# Patient Record
Sex: Female | Born: 1965 | Race: Black or African American | Hispanic: No | Marital: Married
Health system: Midwestern US, Community
[De-identification: ages and names within clinical notes are randomized; demographics above are authoritative.]

## PROBLEM LIST (undated history)

## (undated) DIAGNOSIS — N83209 Unspecified ovarian cyst, unspecified side: Secondary | ICD-10-CM

## (undated) DIAGNOSIS — F419 Anxiety disorder, unspecified: Secondary | ICD-10-CM

## (undated) DIAGNOSIS — F329 Major depressive disorder, single episode, unspecified: Secondary | ICD-10-CM

## (undated) DIAGNOSIS — M797 Fibromyalgia: Secondary | ICD-10-CM

## (undated) DIAGNOSIS — F32A Depression, unspecified: Secondary | ICD-10-CM

## (undated) DIAGNOSIS — F410 Panic disorder [episodic paroxysmal anxiety] without agoraphobia: Secondary | ICD-10-CM

## (undated) DIAGNOSIS — N83201 Unspecified ovarian cyst, right side: Secondary | ICD-10-CM

## (undated) DIAGNOSIS — N6002 Solitary cyst of left breast: Secondary | ICD-10-CM

## (undated) DIAGNOSIS — N83202 Unspecified ovarian cyst, left side: Secondary | ICD-10-CM

## (undated) HISTORY — PX: ABDOMINAL HYSTERECTOMY: SHX81

## (undated) HISTORY — PX: KNEE SURGERY: SHX244

---

## 2011-08-23 ENCOUNTER — Emergency Department (HOSPITAL_COMMUNITY)
Admission: EM | Admit: 2011-08-23 | Discharge: 2011-08-24 | Disposition: A | Discharge status: Home / Self Care | Payer: Medicare Other | Attending: Emergency Medicine | Admitting: Emergency Medicine

## 2011-08-23 ENCOUNTER — Encounter (HOSPITAL_COMMUNITY): Payer: Self-pay | Admitting: Emergency Medicine

## 2011-08-23 ENCOUNTER — Emergency Department (HOSPITAL_COMMUNITY): Payer: Medicare Other

## 2011-08-23 DIAGNOSIS — Z79899 Other long term (current) drug therapy: Secondary | ICD-10-CM | POA: Insufficient documentation

## 2011-08-23 DIAGNOSIS — F341 Dysthymic disorder: Secondary | ICD-10-CM | POA: Insufficient documentation

## 2011-08-23 DIAGNOSIS — R0602 Shortness of breath: Secondary | ICD-10-CM | POA: Insufficient documentation

## 2011-08-23 DIAGNOSIS — J45909 Unspecified asthma, uncomplicated: Secondary | ICD-10-CM | POA: Insufficient documentation

## 2011-08-23 DIAGNOSIS — R079 Chest pain, unspecified: Secondary | ICD-10-CM | POA: Insufficient documentation

## 2011-08-23 DIAGNOSIS — R1032 Left lower quadrant pain: Secondary | ICD-10-CM | POA: Insufficient documentation

## 2011-08-23 DIAGNOSIS — R112 Nausea with vomiting, unspecified: Secondary | ICD-10-CM | POA: Insufficient documentation

## 2011-08-23 HISTORY — DX: Depression, unspecified: F32.A

## 2011-08-23 HISTORY — DX: Anxiety disorder, unspecified: F41.9

## 2011-08-23 HISTORY — DX: Unspecified ovarian cyst, unspecified side: N83.209

## 2011-08-23 HISTORY — DX: Panic disorder (episodic paroxysmal anxiety): F41.0

## 2011-08-23 HISTORY — DX: Major depressive disorder, single episode, unspecified: F32.9

## 2011-08-23 LAB — URINALYSIS, ROUTINE W REFLEX MICROSCOPIC
Bilirubin Urine: NEGATIVE
Hgb urine dipstick: NEGATIVE
Specific Gravity, Urine: 1.013 (ref 1.005–1.030)
pH: 7.5 (ref 5.0–8.0)

## 2011-08-23 LAB — DIFFERENTIAL
Basophils Relative: 0 % (ref 0–1)
Eosinophils Relative: 1 % (ref 0–5)
Monocytes Absolute: 0.5 10*3/uL (ref 0.1–1.0)
Monocytes Relative: 9 % (ref 3–12)
Neutro Abs: 3.5 10*3/uL (ref 1.7–7.7)

## 2011-08-23 LAB — BASIC METABOLIC PANEL
BUN: 12 mg/dL (ref 6–23)
CO2: 23 mEq/L (ref 19–32)
Chloride: 103 mEq/L (ref 96–112)
Creatinine, Ser: 0.68 mg/dL (ref 0.50–1.10)
GFR calc Af Amer: >90 mL/min (ref >90)

## 2011-08-23 LAB — CBC
HCT: 39.6 % (ref 36.0–46.0)
Hemoglobin: 13.2 g/dL (ref 12.0–15.0)
MCHC: 33.3 g/dL (ref 30.0–36.0)
MCV: 89.4 fL (ref 78.0–100.0)

## 2011-08-23 MED ORDER — MORPHINE SULFATE 4 MG/ML IJ SOLN
4.0000 mg | Freq: Once | INTRAMUSCULAR | Status: AC
  Administered 2011-08-23: 4 mg via INTRAVENOUS
  Filled 2011-08-23: qty 1

## 2011-08-23 MED ORDER — ONDANSETRON HCL 4 MG/2ML IJ SOLN
4.0000 mg | Freq: Once | INTRAMUSCULAR | Status: AC
  Administered 2011-08-23: 4 mg via INTRAVENOUS
  Filled 2011-08-23: qty 2

## 2011-08-23 MED ORDER — ASPIRIN 81 MG PO CHEW
324.0000 mg | CHEWABLE_TABLET | Freq: Once | ORAL | Status: AC
Start: 2011-08-23 — End: 2011-08-23
  Administered 2011-08-23: 324 mg via ORAL
  Filled 2011-08-23: qty 4

## 2011-08-23 NOTE — ED Notes (Signed)
States she has an ovarian cyst that is going and affecting her ability to walk x about 3 days.  Also has been having on/off chest pains.  New to town and has no PMD here.

## 2011-08-23 NOTE — ED Notes (Signed)
Pt presenting to ed with c/o chest pain and lower abdominal pain. Pt states onset of chest pain 3 days ago. pt states but symptoms have been intermittent for total 3 weeks. Pt states positive nausea and vomiting with shortness of breath

## 2011-08-23 NOTE — ED Notes (Signed)
Patient transported to Ultrasound with chaperone. 

## 2011-08-23 NOTE — ED Provider Notes (Signed)
History     CSN: [id number]  Arrival date & time 08/23/11  1627   First MD Initiated Contact with Patient 08/23/11 2023      Chief Complaint  Patient presents with  . Ovarian Cyst    LT side  . Chest Pain     HPI  History provided by the patient. Patient is a 46 year old female past history of left ovarian cyst, asthma, anxiety and depression who presents with complaints of chest pain and pressure as well as left lower abdominal pains. Patient reports chest pain began 3 days ago. Pain has some waxing and waning but is constant. Pain seems to be worse with increased stress. Pain is not aggravated with exertion, eating or movements. Pain does not radiate. Patient also complains of left lower quadrant pains she states are similar to left ovarian cyst. Sure ports history of large left ovarian cyst 6-7 cm. Patient had been on medications in the past no GYN and states since that reduced in size during last checkup in June. Pain is now returned and feels similar to previous cyst pains. Patient does have history of hysterectomy. She denies any dysuria, hematuria, urinary frequency, vaginal bleeding or vaginal discharge. Symptoms are described as severe. She denies any aggravating or alleviating factors. Patient has associated episodes of nausea vomiting that time. She denies heart palpitations or diaphoresis. Patient does report shortness of breath was severe pains. Patient has no history of recent surgery, recent long travel, hemoptysis, prior DVT or PE, coagulopathy, or history of cancer.    Past Medical History  Diagnosis Date  . Asthma   . Ovarian cyst   . Depression   . Anxiety   . Panic attacks     Past Surgical History  Procedure Date  . Abdominal hysterectomy     No family history on file.  History  Substance Use Topics  . Smoking status: Never Smoker   . Smokeless tobacco: Not on file  . Alcohol Use: No    OB History    Grav Para Term Preterm Abortions TAB SAB Ect Mult  Living                  Review of Systems  Constitutional: Positive for appetite change. Negative for fever, chills and fatigue.  Respiratory: Positive for shortness of breath. Negative for cough and wheezing.   Cardiovascular: Positive for chest pain. Negative for palpitations.  Gastrointestinal: Positive for nausea, vomiting and abdominal pain. Negative for diarrhea and constipation.  Genitourinary: Negative for dysuria, frequency, hematuria, flank pain, vaginal bleeding and vaginal discharge.    Allergies  Penicillins  Home Medications   Current Outpatient Rx  Name Route Sig Dispense Refill  . ALPRAZOLAM 1 MG PO TABS Oral Take 1 mg by mouth 3 (three) times daily as needed. anxiety    . BUPROPION HCL ER (XL) 150 MG PO TB24 Oral Take 150-300 mg by mouth daily.    Marland Kitchen LEVOTHYROXINE SODIUM 50 MCG PO TABS Oral Take 50 mcg by mouth daily.    Marland Kitchen PANTOPRAZOLE SODIUM 40 MG PO TBEC Oral Take 40 mg by mouth daily.    . QUETIAPINE FUMARATE 25 MG PO TABS Oral Take 100 mg by mouth daily.    . TRAMADOL HCL 50 MG PO TABS Oral Take 50 mg by mouth every 6 (six) hours as needed. pain      BP 140/77  Pulse 70  Temp(Src) 98.7 F (37.1 C) (Oral)  Resp 16  Wt 211 lb (95.709 kg)  SpO2 97%  Physical Exam  Nursing note and vitals reviewed. Constitutional: She is oriented to person, place, and time. She appears well-developed and well-nourished. No distress.  HENT:  Head: Normocephalic and atraumatic.  Neck: Normal range of motion. Neck supple.  Cardiovascular: Normal rate and regular rhythm.   No murmur heard. Pulmonary/Chest: Effort normal and breath sounds normal. No respiratory distress. She has no wheezes. She has no rales. She exhibits tenderness.       Reproducible pain over medial and upper bilateral chest with palpation. No deformities. Normal movement with breathing. No crepitus.  Abdominal: Soft. There is tenderness in the left lower quadrant. There is no rebound, no guarding, no CVA  tenderness, no tenderness at McBurney's point and negative Murphy's sign.  Musculoskeletal: She exhibits no edema and no tenderness.       Clinical signs for DVT.  Neurological: She is alert and oriented to person, place, and time.  Skin: Skin is warm and dry. No rash noted.  Psychiatric: She has a normal mood and affect. Her behavior is normal.    ED Course  Procedures   Results for orders placed during the hospital encounter of 08/23/11  CBC      Component Value Range   WBC 6.0  4.0 - 10.5 (K/uL)   RBC 4.43  3.87 - 5.11 (MIL/uL)   Hemoglobin 13.2  12.0 - 15.0 (g/dL)   HCT 96.0  45.4 - 09.8 (%)   MCV 89.4  78.0 - 100.0 (fL)   MCH 29.8  26.0 - 34.0 (pg)   MCHC 33.3  30.0 - 36.0 (g/dL)   RDW 11.9  14.7 - 82.9 (%)   Platelets 342  150 - 400 (K/uL)  DIFFERENTIAL      Component Value Range   Neutrophils Relative 59  43 - 77 (%)   Neutro Abs 3.5  1.7 - 7.7 (K/uL)   Lymphocytes Relative 31  12 - 46 (%)   Lymphs Abs 1.9  0.7 - 4.0 (K/uL)   Monocytes Relative 9  3 - 12 (%)   Monocytes Absolute 0.5  0.1 - 1.0 (K/uL)   Eosinophils Relative 1  0 - 5 (%)   Eosinophils Absolute 0.1  0.0 - 0.7 (K/uL)   Basophils Relative 0  0 - 1 (%)   Basophils Absolute 0.0  0.0 - 0.1 (K/uL)  BASIC METABOLIC PANEL      Component Value Range   Sodium 136  135 - 145 (mEq/L)   Potassium 4.2  3.5 - 5.1 (mEq/L)   Chloride 103  96 - 112 (mEq/L)   CO2 23  19 - 32 (mEq/L)   Glucose, Bld 91  70 - 99 (mg/dL)   BUN 12  6 - 23 (mg/dL)   Creatinine, Ser 5.62  0.50 - 1.10 (mg/dL)   Calcium 8.9  8.4 - 13.0 (mg/dL)   GFR calc non Af Amer >90  >90 (mL/min)   GFR calc Af Amer >90  >90 (mL/min)  TROPONIN I      Component Value Range   Troponin I <0.30  <0.30 (ng/mL)  URINALYSIS, ROUTINE W REFLEX MICROSCOPIC      Component Value Range   Color, Urine YELLOW  YELLOW    APPearance HAZY (*) CLEAR    Specific Gravity, Urine 1.013  1.005 - 1.030    pH 7.5  5.0 - 8.0    Glucose, UA NEGATIVE  NEGATIVE (mg/dL)   Hgb  urine dipstick NEGATIVE  NEGATIVE    Bilirubin Urine  NEGATIVE  NEGATIVE    Ketones, ur NEGATIVE  NEGATIVE (mg/dL)   Protein, ur NEGATIVE  NEGATIVE (mg/dL)   Urobilinogen, UA 0.2  0.0 - 1.0 (mg/dL)   Nitrite NEGATIVE  NEGATIVE    Leukocytes, UA NEGATIVE  NEGATIVE      Dg Chest 2 View  08/23/2011  *RADIOLOGY REPORT*  Clinical Data: Chest pain and fever.  CHEST - 2 VIEW  Comparison: None  Findings: The cardiomediastinal silhouette is unremarkable. The lungs are clear. There is no evidence of focal airspace disease, pulmonary edema, suspicious pulmonary nodule/mass, pleural effusion, or pneumothorax. No acute bony abnormalities are identified.  IMPRESSION: No evidence of active cardiopulmonary disease.  Original Report Authenticated By: Rosendo Gros, M.D.   US Transvaginal Non-ob  08/23/2011  *RADIOLOGY REPORT*  Clinical Data:  Left pelvic pain, evaluate for torsion.  Status post hysterectomy.  TRANSABDOMINAL AND TRANSVAGINAL ULTRASOUND OF PELVIS DOPPLER ULTRASOUND OF OVARIES  Technique:  Both transabdominal and transvaginal ultrasound examinations of the pelvis were performed. Transabdominal technique was performed for global imaging of the pelvis including uterus, ovaries, adnexal regions, and pelvic cul-de-sac.  It was necessary to proceed with endovaginal exam following the transabdominal exam to visualize the bilateral ovaries.  Color and duplex Doppler ultrasound was utilized to evaluate blood flow to the ovaries.  Comparison:  None.  Findings:  Uterus:  Surgically absent.  Right ovary: Measures 4.0 x 2.3 x 3.0 cm.  2.3 x 1.1 x 1.4 cm involuting corpus luteal cyst.  Left ovary:   Poorly visualized.  Measures 4.5 x 2.9 x 2.1 cm.  Pulsed Doppler evaluation demonstrates normal low-resistance arterial and venous waveforms in both ovaries.  IMPRESSION: Status post hysterectomy.  Otherwise normal pelvic ultrasound.  No sonographic evidence for ovarian torsion.  Original Report Authenticated By: Charline Bills, M.D.   US Pelvis Complete  08/23/2011  *RADIOLOGY REPORT*  Clinical Data:  Left pelvic pain, evaluate for torsion.  Status post hysterectomy.  TRANSABDOMINAL AND TRANSVAGINAL ULTRASOUND OF PELVIS DOPPLER ULTRASOUND OF OVARIES  Technique:  Both transabdominal and transvaginal ultrasound examinations of the pelvis were performed. Transabdominal technique was performed for global imaging of the pelvis including uterus, ovaries, adnexal regions, and pelvic cul-de-sac.  It was necessary to proceed with endovaginal exam following the transabdominal exam to visualize the bilateral ovaries.  Color and duplex Doppler ultrasound was utilized to evaluate blood flow to the ovaries.  Comparison:  None.  Findings:  Uterus:  Surgically absent.  Right ovary: Measures 4.0 x 2.3 x 3.0 cm.  2.3 x 1.1 x 1.4 cm involuting corpus luteal cyst.  Left ovary:   Poorly visualized.  Measures 4.5 x 2.9 x 2.1 cm.  Pulsed Doppler evaluation demonstrates normal low-resistance arterial and venous waveforms in both ovaries.  IMPRESSION: Status post hysterectomy.  Otherwise normal pelvic ultrasound.  No sonographic evidence for ovarian torsion.  Original Report Authenticated By: Charline Bills, M.D.   [place] Art/ven Flow Abd Pelv Doppler  08/23/2011  *RADIOLOGY REPORT*  Clinical Data:  Left pelvic pain, evaluate for torsion.  Status post hysterectomy.  TRANSABDOMINAL AND TRANSVAGINAL ULTRASOUND OF PELVIS DOPPLER ULTRASOUND OF OVARIES  Technique:  Both transabdominal and transvaginal ultrasound examinations of the pelvis were performed. Transabdominal technique was performed for global imaging of the pelvis including uterus, ovaries, adnexal regions, and pelvic cul-de-sac.  It was necessary to proceed with endovaginal exam following the transabdominal exam to visualize the bilateral ovaries.  Color and duplex Doppler ultrasound was utilized to evaluate blood flow to the ovaries.  Comparison:  None.  Findings:  Uterus:  Surgically absent.   Right ovary: Measures 4.0 x 2.3 x 3.0 cm.  2.3 x 1.1 x 1.4 cm involuting corpus luteal cyst.  Left ovary:   Poorly visualized.  Measures 4.5 x 2.9 x 2.1 cm.  Pulsed Doppler evaluation demonstrates normal low-resistance arterial and venous waveforms in both ovaries.  IMPRESSION: Status post hysterectomy.  Otherwise normal pelvic ultrasound.  No sonographic evidence for ovarian torsion.  Original Report Authenticated By: Charline Bills, M.D.     1. Chest pain   2. Abdominal pain       MDM  8:20 PM patient seen and evaluated. Patient in no acute distress does appear slightly uncomfortable.  Pt is PERC negative.   Patient discussed with attending physician. Labs unremarkable. Ultrasound negative with no signs of ovarian torsion or concerning finding. Patient has atypical chest pain is reproducible on exam. Patient has normal lab testing, troponin, EKG and chest x-ray. She is felt able to return home.   Date: 08/23/2011  Rate: 86  Rhythm: normal sinus rhythm  QRS Axis: normal  Intervals: normal  ST/T Wave abnormalities: nonspecific T wave changes  Conduction Disutrbances:none  Narrative Interpretation:   Old EKG Reviewed: none available    Angus Seller, [place] 08/24/11 [phone]

## 2011-08-24 ENCOUNTER — Encounter (HOSPITAL_COMMUNITY): Payer: Self-pay | Admitting: Emergency Medicine

## 2011-08-24 ENCOUNTER — Emergency Department (HOSPITAL_COMMUNITY)
Admission: EM | Admit: 2011-08-24 | Discharge: 2011-08-24 | Disposition: A | Discharge status: Home / Self Care | Payer: Medicare Other | Attending: Emergency Medicine | Admitting: Emergency Medicine

## 2011-08-24 DIAGNOSIS — F411 Generalized anxiety disorder: Secondary | ICD-10-CM | POA: Insufficient documentation

## 2011-08-24 DIAGNOSIS — R079 Chest pain, unspecified: Secondary | ICD-10-CM | POA: Insufficient documentation

## 2011-08-24 DIAGNOSIS — Z79899 Other long term (current) drug therapy: Secondary | ICD-10-CM | POA: Insufficient documentation

## 2011-08-24 DIAGNOSIS — J45909 Unspecified asthma, uncomplicated: Secondary | ICD-10-CM | POA: Insufficient documentation

## 2011-08-24 DIAGNOSIS — R0602 Shortness of breath: Secondary | ICD-10-CM | POA: Insufficient documentation

## 2011-08-24 DIAGNOSIS — F3289 Other specified depressive episodes: Secondary | ICD-10-CM | POA: Insufficient documentation

## 2011-08-24 DIAGNOSIS — F329 Major depressive disorder, single episode, unspecified: Secondary | ICD-10-CM | POA: Insufficient documentation

## 2011-08-24 DIAGNOSIS — R002 Palpitations: Secondary | ICD-10-CM | POA: Insufficient documentation

## 2011-08-24 MED ORDER — HYDROCODONE-ACETAMINOPHEN 5-325 MG PO TABS: 1.0000 | ORAL_TABLET | ORAL | Status: AC | PRN

## 2011-08-24 MED ORDER — NAPROXEN 500 MG PO TABS: 500.0000 mg | ORAL_TABLET | Freq: Two times a day (BID) | ORAL | Status: AC

## 2011-08-24 NOTE — Discharge Instructions (Signed)
Return to the hospital for severe or worsening symptoms  RESOURCE GUIDE  Dental Problems  Patients with Medicaid: [facility]                     [facility] [address]                                           [address] Phone:  [phone]                                                  Phone:  [phone]  If unable to pay or uninsured, contact:  [facility] or [facility] to become qualified for the adult dental clinic.  Chronic Pain Problems Contact [facility]  [phone] Patients need to be referred by their primary care doctor.  Insufficient Money for Medicine Contact [employer]:  call "211" or [facility] 271-5999.  No Primary Care Doctor Call [facility]  832-8000 Other agencies that provide inexpensive medical care    [facility]  [phone]    [facility]  [phone]    [facility]  [phone]    [facility] Clinic  [phone]    [facility]  [phone]    [facility]  [phone]  Psychological Services [facility]  [phone] [facility]  [phone] [facility]   [phone] (emergency services [phone])  Substance Abuse Resources [facility]  336-882-2125 [facility] 336-784-9470 [facility] 336-285-9073 [facility] 336-845-3988 Residential & Outpatient Substance Abuse Program  800-659-3381  Abuse/Neglect [place] Child Abuse Hotline (336) 641-3795 [place] Child Abuse Hotline 800-378-5315 (After Hours)  Emergency Shelter [facility] (336) 271-5985  [facility] (336) 275-9566 [facility] (704) 372-4663  MRSA Hotline #:   832-7006    [place] [facility]  [facility] [place]     [employer]                          [facility] [address] [place]                       [address]      [address]  [place]                                                [place]                            [place] Phone:  [phone]                                   Phone:  [phone]                 Phone:    [phone]  [facility] Phone:  [phone]  [place] Child Abuse Hotline (336) 342-1394 (336) 342-3537 (After Hours)   

## 2011-08-24 NOTE — ED Notes (Signed)
Pt. Discharged to home NAD noted, respirations even and regular

## 2011-08-24 NOTE — ED Notes (Signed)
Pt presented to the ER with c/o CP, pt was just seen in the ER and was sitting in the waiting room when came to the window and reported that CP is back. Pt decided to sign her self back in.

## 2011-08-24 NOTE — Discharge Instructions (Signed)
Your lab tests, EKG of your heart, chest x-ray and ultrasound studies have not shown any signs for concerning or emergent cause your symptoms. There is no signs for a large ovarian cyst. It is recommended that he have a recheck of your abdominal pains in the next 24 to 48 hours. You may return to the emergency room or followup with a primary care provider. Return sooner for any worsening symptoms, fever, chills, persistent nausea vomiting.   Abdominal Pain Abdominal pain can be caused by many things. Your caregiver decides the seriousness of your pain by an examination and possibly blood tests and X-rays. Many cases can be observed and treated at home. Most abdominal pain is not caused by a disease and will probably improve without treatment. However, in many cases, more time must pass before a clear cause of the pain can be found. Before that point, it may not be known if you need more testing, or if hospitalization or surgery is needed. HOME CARE INSTRUCTIONS   Do not take laxatives unless directed by your caregiver.   Take pain medicine only as directed by your caregiver.   Only take over-the-counter or prescription medicines for pain, discomfort, or fever as directed by your caregiver.   Try a clear liquid diet (broth, tea, or water) for as long as directed by your caregiver. Slowly move to a bland diet as tolerated.  SEEK IMMEDIATE MEDICAL CARE IF:   The pain does not go away.   You have a fever.   You keep throwing up (vomiting).   The pain is felt only in portions of the abdomen. Pain in the right side could possibly be appendicitis. In an adult, pain in the left lower portion of the abdomen could be colitis or diverticulitis.   You pass bloody or black tarry stools.  MAKE SURE YOU:   Understand these instructions.   Will watch your condition.   Will get help right away if you are not doing well or get worse.  Document Released: 02/06/2005 Document Revised: 04/18/2011 Document  Reviewed: 12/16/2007 [facility] Patient Information 2012 [place], [place].   Chest Pain (Nonspecific) It is often hard to give a specific diagnosis for the cause of chest pain. There is always a chance that your pain could be related to something serious, such as a heart attack or a blood clot in the lungs. You need to follow up with your caregiver for further evaluation. CAUSES   Heartburn.   Pneumonia or bronchitis.   Anxiety or stress.   Inflammation around your heart (pericarditis) or lung (pleuritis or pleurisy).   A blood clot in the lung.   A collapsed lung (pneumothorax). It can develop suddenly on its own (spontaneous pneumothorax) or from injury (trauma) to the chest.   Shingles infection (herpes zoster virus).  The chest wall is composed of bones, muscles, and cartilage. Any of these can be the source of the pain.  The bones can be bruised by injury.   The muscles or cartilage can be strained by coughing or overwork.   The cartilage can be affected by inflammation and become sore (costochondritis).  DIAGNOSIS  Lab tests or other studies, such as X-rays, electrocardiography, stress testing, or cardiac imaging, may be needed to find the cause of your pain.  TREATMENT   Treatment depends on what may be causing your chest pain. Treatment may include:   Acid blockers for heartburn.   Anti-inflammatory medicine.   Pain medicine for inflammatory conditions.   Antibiotics if  an infection is present.   You may be advised to change lifestyle habits. This includes stopping smoking and avoiding alcohol, caffeine, and chocolate.   You may be advised to keep your head raised (elevated) when sleeping. This reduces the chance of acid going backward from your stomach into your esophagus.   Most of the time, nonspecific chest pain will improve within 2 to 3 days with rest and mild pain medicine.  HOME CARE INSTRUCTIONS   If antibiotics were prescribed, take your antibiotics as  directed. Finish them even if you start to feel better.   For the next few days, avoid physical activities that bring on chest pain. Continue physical activities as directed.   Do not smoke.   Avoid drinking alcohol.   Only take over-the-counter or prescription medicine for pain, discomfort, or fever as directed by your caregiver.   Follow your caregiver's suggestions for further testing if your chest pain does not go away.   Keep any follow-up appointments you made. If you do not go to an appointment, you could develop lasting (chronic) problems with pain. If there is any problem keeping an appointment, you must call to reschedule.  SEEK MEDICAL CARE IF:   You think you are having problems from the medicine you are taking. Read your medicine instructions carefully.   Your chest pain does not go away, even after treatment.   You develop a rash with blisters on your chest.  SEEK IMMEDIATE MEDICAL CARE IF:   You have increased chest pain or pain that spreads to your arm, neck, jaw, back, or abdomen.   You develop shortness of breath, an increasing cough, or you are coughing up blood.   You have severe back or abdominal pain, feel nauseous, or vomit.   You develop severe weakness, fainting, or chills.   You have a fever.  THIS IS AN EMERGENCY. Do not wait to see if the pain will go away. Get medical help at once. Call your local emergency services (911 in [place] Do not drive yourself to the hospital. MAKE SURE YOU:   Understand these instructions.   Will watch your condition.   Will get help right away if you are not doing well or get worse.  Document Released: 02/06/2005 Document Revised: 04/18/2011 Document Reviewed: 12/03/2007 [facility] Patient Information 2012 [place], [place].

## 2011-08-24 NOTE — ED Provider Notes (Signed)
History     CSN: [id number]  Arrival date & time 08/24/11  0407   First MD Initiated Contact with Patient 08/24/11 0505      Chief Complaint  Patient presents with  . Shortness of Breath    dyspnea  . 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 She presents to the hospital for a second visit in 12 hours. Her first visit was for lower pelvic pain and intermittent chest pain. Her workup was negative including EKG, labs, ultrasound of the pelvis. She states that over the last several hours she has had a poking sensation which occurs approximately once every 20 minutes last less than a second and goes away spontaneously. She is concerned that this is related to her rheumatic fever. She has no fevers nausea vomiting chills coughing shortness of breath back pain swelling of the legs dysuria diarrhea. She says that her abdominal pain is completely resolved since her prior visit.  Patient is a 46 y.o. Maddox presenting with shortness of breath and chest pain. The history is provided by the patient and medical records.  Shortness of Breath  Associated symptoms include chest pain and shortness of breath.  Chest Pain Primary symptoms include shortness of breath.     Past Medical History  Diagnosis Date  . Asthma   . Ovarian cyst   . Depression   . Anxiety   . Panic attacks     Past Surgical History  Procedure Date  . Abdominal hysterectomy     History reviewed. No pertinent family history.  History  Substance Use Topics  . Smoking status: Never Smoker   . Smokeless tobacco: Not on file  . Alcohol Use: No    OB History    Grav Para Term Preterm Abortions TAB SAB Ect Mult Living                  Review of Systems  Respiratory: Positive for shortness of breath.     Cardiovascular: Positive for chest pain.  All other systems reviewed and are negative.    Allergies  Penicillins  Home Medications   Current Outpatient Rx  Name Route Sig Dispense Refill  . ALPRAZOLAM 1 MG PO TABS Oral Take 1 mg by mouth 3 (three) times daily as needed. anxiety    . BUPROPION HCL ER (XL) 150 MG PO TB24 Oral Take 300-450 mg by mouth daily. 300-450 depending on how her mood is    . LEVOTHYROXINE SODIUM 50 MCG PO TABS Oral Take 50 mcg by mouth daily.    Marland Kitchen PANTOPRAZOLE SODIUM 40 MG PO TBEC Oral Take 40 mg by mouth daily.    . QUETIAPINE FUMARATE 25 MG PO TABS Oral Take 100 mg by mouth daily.    . TRAMADOL HCL 50 MG PO TABS Oral Take 50 mg by mouth every 6 (six) hours as needed. pain    . HYDROCODONE-ACETAMINOPHEN 5-325 MG PO TABS Oral Take 1-2 tablets by mouth every 4 (four) hours as needed for pain. 20 tablet 0  . NAPROXEN 500 MG PO TABS Oral Take 1 tablet (500 mg total) by mouth 2 (two) times daily with a meal. 30 tablet 0    BP 134/89  Pulse 78  Temp(Src) 98.6 F (37 C) (Oral)  Resp 18  SpO2 98%  Physical Exam  Nursing note and vitals reviewed.  Constitutional: She appears well-developed and well-nourished. No distress.  HENT:  Head: Normocephalic and atraumatic.  Mouth/Throat: Oropharynx is clear and moist. No oropharyngeal exudate.  Eyes: Conjunctivae and EOM are normal. Pupils are equal, round, and reactive to light. Right eye exhibits no discharge. Left eye exhibits no discharge. No scleral icterus.  Neck: Normal range of motion. Neck supple. No JVD present. No thyromegaly present.  Cardiovascular: Normal rate, regular rhythm, normal heart sounds and intact distal pulses.  Exam reveals no gallop and no friction rub.   No murmur heard.      Occasional ectopy  Pulmonary/Chest: Effort normal and breath sounds normal. No respiratory distress. She has no wheezes. She has no rales.  Abdominal: Soft. Bowel sounds are normal. She exhibits no distension and no  mass. There is no tenderness.  Musculoskeletal: Normal range of motion. She exhibits no edema and no tenderness.  Lymphadenopathy:    She has no cervical adenopathy.  Neurological: She is alert. Coordination normal.  Skin: Skin is warm and dry. No rash noted. No erythema.  Psychiatric: She has a normal mood and affect. Her behavior is normal.    ED Course  Procedures (including critical care time)  Labs Reviewed - No data to display  ED ECG REPORT   Date: 08/24/2011   Rate: 80  Rhythm: normal sinus rhythm  QRS Axis: normal  Intervals: normal  ST/T Wave abnormalities: normal  Conduction Disutrbances:none  Narrative Interpretation:   Old EKG Reviewed: unchanged   1. Chest pain   2. Palpitations       MDM  The patient is well-appearing, EKG is normal with no signs of ischemia or abnormal findings. Her ectopy is the likely source of her palpitations, review of her blood work from prior visit shows no significant findings and a normal troponin. I have given her reassurance that this is not related to a significant cardiac problem and want her to followup with her family doctor, resource list given. Naprosyn for any pain.        Vida Roller, MD 08/24/11 [phone]

## 2011-08-24 NOTE — ED Notes (Signed)
Pt. Discharged to home via w/c NAD noted, respirations even and regular

## 2011-08-25 NOTE — ED Provider Notes (Signed)
Medical screening examination/treatment/procedure(s) were conducted as a shared visit with non-physician practitioner(s) and myself.  I personally evaluated the patient during the encounter Pt with left abd pain, currently improved. abd soft nt.   Suzi Roots, MD 08/25/11 [phone]

## 2012-08-31 ENCOUNTER — Encounter (HOSPITAL_COMMUNITY): Payer: Self-pay | Admitting: Emergency Medicine

## 2012-08-31 ENCOUNTER — Emergency Department (INDEPENDENT_AMBULATORY_CARE_PROVIDER_SITE_OTHER)
Admission: EM | Admit: 2012-08-31 | Discharge: 2012-08-31 | Disposition: A | Discharge status: Home / Self Care | Payer: Medicare Other | Source: Home / Self Care | Attending: Emergency Medicine | Admitting: Emergency Medicine

## 2012-08-31 DIAGNOSIS — F419 Anxiety disorder, unspecified: Secondary | ICD-10-CM

## 2012-08-31 DIAGNOSIS — K219 Gastro-esophageal reflux disease without esophagitis: Secondary | ICD-10-CM

## 2012-08-31 DIAGNOSIS — F411 Generalized anxiety disorder: Secondary | ICD-10-CM

## 2012-08-31 MED ORDER — PANTOPRAZOLE SODIUM 40 MG PO TBEC: 40.0000 mg | DELAYED_RELEASE_TABLET | Freq: Every day | ORAL | Status: DC

## 2012-08-31 NOTE — ED Provider Notes (Signed)
History     CSN: [id number]  Arrival date & time 08/31/12  1201   First MD Initiated Contact with Patient 08/31/12 1350      Chief Complaint  Patient presents with  . Medication Refill    (Consider location/radiation/quality/duration/timing/severity/associated sxs/prior treatment) HPI Comments: Pt reports having transportation issues and not being able to get to see psychiatrist in [place], so she was let go from practice.  Has 3 days left of medication, asks for refills until she can find a new psychiatrist.  Increased stress right now because her father is sick in [place] but pt reports her medicines are still working well for her. Also requests refill of protonix as she is almost out.  Reports increased stress of father's illness is making her acid reflux worse; she has also been eating a poor diet lately that is making her reflux worse.   Patient is a 47 y.o. female presenting with anxiety. The history is provided by the patient.  Anxiety This is a chronic problem. Episode onset: years ago. The problem occurs daily. The problem has not changed since onset.Pertinent negatives include no abdominal pain. The symptoms are aggravated by stress. The symptoms are relieved by medications. Treatments tried: own medications. The treatment provided significant relief.    Past Medical History  Diagnosis Date  . Asthma   . Ovarian cyst   . Depression   . Anxiety   . Panic attacks     Past Surgical History  Procedure Laterality Date  . Abdominal hysterectomy    . Knee surgery      No family history on file.  History  Substance Use Topics  . Smoking status: Never Smoker   . Smokeless tobacco: Not on file  . Alcohol Use: No    OB History   Grav Para Term Preterm Abortions TAB SAB Ect Mult Living                  Review of Systems  Constitutional: Negative for activity change and appetite change.  Gastrointestinal: Negative for nausea, vomiting and abdominal pain.        Acid reflux, worst at night  Psychiatric/Behavioral: Negative for suicidal ideas, behavioral problems, self-injury and dysphoric mood. The patient is nervous/anxious.        Hx anxiety    Allergies  Penicillins  Home Medications   Current Outpatient Rx  Name  Route  Sig  Dispense  Refill  . ALPRAZolam (XANAX) 1 MG tablet   Oral   Take 1 mg by mouth 3 (three) times daily as needed. anxiety         . buPROPion (WELLBUTRIN XL) 150 MG 24 hr tablet   Oral   Take 300-450 mg by mouth daily. 300-450 depending on how her mood is         . levothyroxine (SYNTHROID, LEVOTHROID) 50 MCG tablet   Oral   Take 50 mcg by mouth daily.         . pantoprazole (PROTONIX) 40 MG tablet   Oral   Take 40 mg by mouth daily.         . QUEtiapine (SEROQUEL) 25 MG tablet   Oral   Take 100 mg by mouth daily.         . traMADol (ULTRAM) 50 MG tablet   Oral   Take 50 mg by mouth every 6 (six) hours as needed. pain         . pantoprazole (PROTONIX) 40 MG tablet  Oral   Take 1 tablet (40 mg total) by mouth daily.   30 tablet   0     BP 137/79  Pulse 88  Temp(Src) 97.2 F (36.2 C) (Oral)  Resp 16  SpO2 98%  Physical Exam  Constitutional: She is oriented to person, place, and time. She appears well-developed and well-nourished. No distress.  Neurological: She is alert and oriented to person, place, and time.  Psychiatric: She has a normal mood and affect. Her behavior is normal. Judgment and thought content normal.    ED Course  Procedures (including critical care time)  Labs Reviewed - No data to display No results found.   1. GERD (gastroesophageal reflux disease)   2. Anxiety       MDM  Rx protonix 40mg  #30. Explained to pt we do not refill psych meds here, referred her to Behavioral health, pt happy with referral.         Cathlyn Parsons, NP 08/31/12 1358

## 2012-08-31 NOTE — ED Provider Notes (Signed)
Medical screening examination/treatment/procedure(s) were performed by non-physician practitioner and as supervising physician I was immediately available for consultation/collaboration.  Machelle Raybon   Brielle Moro, MD 08/31/12 1407 

## 2012-08-31 NOTE — ED Notes (Signed)
Pt is here to have her meds refilled States that her psychiatrist dismissed her for not showing up to her appts and PCP will not see her until she pays med bills Denies any med prob.   She is alert and oriented w/no signs of acute distress.

## 2012-09-18 ENCOUNTER — Emergency Department (HOSPITAL_COMMUNITY)
Admission: EM | Admit: 2012-09-18 | Discharge: 2012-09-19 | Disposition: A | Discharge status: Other Acute Inpatient Hospital | Payer: Medicare Other | Source: Home / Self Care | Attending: Emergency Medicine | Admitting: Emergency Medicine

## 2012-09-18 ENCOUNTER — Encounter (HOSPITAL_COMMUNITY): Payer: Self-pay | Admitting: Adult Health

## 2012-09-18 DIAGNOSIS — F3289 Other specified depressive episodes: Secondary | ICD-10-CM | POA: Insufficient documentation

## 2012-09-18 DIAGNOSIS — F410 Panic disorder [episodic paroxysmal anxiety] without agoraphobia: Secondary | ICD-10-CM | POA: Insufficient documentation

## 2012-09-18 DIAGNOSIS — Z880 Allergy status to penicillin: Secondary | ICD-10-CM | POA: Insufficient documentation

## 2012-09-18 DIAGNOSIS — R45851 Suicidal ideations: Secondary | ICD-10-CM | POA: Insufficient documentation

## 2012-09-18 DIAGNOSIS — IMO0001 Reserved for inherently not codable concepts without codable children: Secondary | ICD-10-CM | POA: Insufficient documentation

## 2012-09-18 DIAGNOSIS — Z8742 Personal history of other diseases of the female genital tract: Secondary | ICD-10-CM | POA: Insufficient documentation

## 2012-09-18 DIAGNOSIS — J45909 Unspecified asthma, uncomplicated: Secondary | ICD-10-CM | POA: Insufficient documentation

## 2012-09-18 DIAGNOSIS — Z3202 Encounter for pregnancy test, result negative: Secondary | ICD-10-CM | POA: Insufficient documentation

## 2012-09-18 DIAGNOSIS — Z79899 Other long term (current) drug therapy: Secondary | ICD-10-CM | POA: Insufficient documentation

## 2012-09-18 DIAGNOSIS — F329 Major depressive disorder, single episode, unspecified: Secondary | ICD-10-CM | POA: Insufficient documentation

## 2012-09-18 HISTORY — DX: Fibromyalgia: M79.7

## 2012-09-18 LAB — RAPID URINE DRUG SCREEN, HOSP PERFORMED
Amphetamines: NOT DETECTED
Cocaine: NOT DETECTED
Opiates: NOT DETECTED
Tetrahydrocannabinol: NOT DETECTED

## 2012-09-18 LAB — COMPREHENSIVE METABOLIC PANEL
ALT: 25 U/L (ref 0–35)
AST: 20 U/L (ref 0–37)
Calcium: 8.8 mg/dL (ref 8.4–10.5)
Creatinine, Ser: 0.86 mg/dL (ref 0.50–1.10)
GFR calc Af Amer: >90 mL/min (ref >90)
Sodium: 137 mEq/L (ref 135–145)
Total Protein: 6.9 g/dL (ref 6.0–8.3)

## 2012-09-18 LAB — CBC
MCH: 29.5 pg (ref 26.0–34.0)
MCHC: 34.3 g/dL (ref 30.0–36.0)
MCV: 86 fL (ref 78.0–100.0)
Platelets: 255 10*3/uL (ref 150–400)
RDW: 14.8 % (ref 11.5–15.5)

## 2012-09-18 LAB — SALICYLATE LEVEL: Salicylate Lvl: <2 mg/dL — ABNORMAL LOW (ref 2.8–20.0)

## 2012-09-18 MED ORDER — BUPROPION HCL ER (XL) 150 MG PO TB24
150.0000 mg | ORAL_TABLET | Freq: Every day | ORAL | Status: DC | PRN
  Filled 2012-09-18: qty 2

## 2012-09-18 MED ORDER — PANTOPRAZOLE SODIUM 40 MG PO TBEC
40.0000 mg | DELAYED_RELEASE_TABLET | Freq: Every day | ORAL | Status: DC
  Administered 2012-09-19: 40 mg via ORAL
  Filled 2012-09-18: qty 1

## 2012-09-18 MED ORDER — LEVOTHYROXINE SODIUM 25 MCG PO TABS
25.0000 ug | ORAL_TABLET | Freq: Every day | ORAL | Status: DC
  Administered 2012-09-19: 25 ug via ORAL
  Filled 2012-09-18 (×3): qty 1

## 2012-09-18 MED ORDER — QUETIAPINE FUMARATE 25 MG PO TABS
50.0000 mg | ORAL_TABLET | Freq: Two times a day (BID) | ORAL | Status: DC
  Administered 2012-09-19 (×2): 50 mg via ORAL
  Filled 2012-09-18 (×2): qty 2

## 2012-09-18 MED ORDER — ZOLPIDEM TARTRATE 5 MG PO TABS: 5.0000 mg | ORAL_TABLET | Freq: Every evening | ORAL | Status: DC | PRN

## 2012-09-18 MED ORDER — ONDANSETRON HCL 4 MG PO TABS: 4.0000 mg | ORAL_TABLET | Freq: Three times a day (TID) | ORAL | Status: DC | PRN

## 2012-09-18 MED ORDER — ALUM & MAG HYDROXIDE-SIMETH 200-200-20 MG/5ML PO SUSP: 30.0000 mL | ORAL | Status: DC | PRN

## 2012-09-18 MED ORDER — ALPRAZOLAM 0.25 MG PO TABS
1.0000 mg | ORAL_TABLET | Freq: Two times a day (BID) | ORAL | Status: DC | PRN
  Administered 2012-09-19: 1 mg via ORAL
  Filled 2012-09-18: qty 4

## 2012-09-18 MED ORDER — IBUPROFEN 400 MG PO TABS
600.0000 mg | ORAL_TABLET | Freq: Three times a day (TID) | ORAL | Status: DC | PRN
  Administered 2012-09-19: 600 mg via ORAL
  Filled 2012-09-18: qty 1

## 2012-09-18 MED ORDER — NICOTINE 21 MG/24HR TD PT24: 21.0000 mg | MEDICATED_PATCH | Freq: Every day | TRANSDERMAL | Status: DC

## 2012-09-18 MED ORDER — PANTOPRAZOLE SODIUM 40 MG PO TBEC
40.0000 mg | DELAYED_RELEASE_TABLET | Freq: Every day | ORAL | Status: DC
  Filled 2012-09-18: qty 1

## 2012-09-18 MED ORDER — TRAMADOL HCL 50 MG PO TABS
50.0000 mg | ORAL_TABLET | Freq: Four times a day (QID) | ORAL | Status: DC | PRN
  Administered 2012-09-19: 50 mg via ORAL
  Filled 2012-09-18: qty 1

## 2012-09-18 MED ORDER — LORAZEPAM 1 MG PO TABS: 1.0000 mg | ORAL_TABLET | Freq: Three times a day (TID) | ORAL | Status: DC | PRN

## 2012-09-18 NOTE — ED Notes (Signed)
Called [employer] and spoke with Selena Batten, Charity fundraiser. No sitters available at this time.

## 2012-09-18 NOTE — ED Notes (Signed)
Pt wants wants to wait on having blood drawn
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
## 2012-09-18 NOTE — ED Notes (Signed)
Sitter is at bedside. Introduced her to the patient.
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
## 2012-09-18 NOTE — ED Notes (Signed)
Patient placed in paper scrubs and security called for wanding.

## 2012-09-18 NOTE — ED Notes (Signed)
Patient given meal bag and drink. 

## 2012-09-19 ENCOUNTER — Encounter (HOSPITAL_COMMUNITY): Payer: Self-pay | Admitting: Behavioral Health

## 2012-09-19 ENCOUNTER — Inpatient Hospital Stay (HOSPITAL_COMMUNITY)
Admission: AD | Admit: 2012-09-19 | Discharge: 2012-09-25 | DRG: 882 | Disposition: A | Discharge status: Home / Self Care | Payer: Medicare Other | Source: Ambulatory Visit | Attending: Psychiatry | Admitting: Psychiatry

## 2012-09-19 DIAGNOSIS — J45909 Unspecified asthma, uncomplicated: Secondary | ICD-10-CM | POA: Diagnosis present

## 2012-09-19 DIAGNOSIS — F4310 Post-traumatic stress disorder, unspecified: Principal | ICD-10-CM | POA: Diagnosis present

## 2012-09-19 DIAGNOSIS — F341 Dysthymic disorder: Secondary | ICD-10-CM | POA: Diagnosis present

## 2012-09-19 DIAGNOSIS — F332 Major depressive disorder, recurrent severe without psychotic features: Secondary | ICD-10-CM

## 2012-09-19 DIAGNOSIS — F3130 Bipolar disorder, current episode depressed, mild or moderate severity, unspecified: Secondary | ICD-10-CM | POA: Diagnosis present

## 2012-09-19 DIAGNOSIS — Z79899 Other long term (current) drug therapy: Secondary | ICD-10-CM

## 2012-09-19 DIAGNOSIS — R45851 Suicidal ideations: Secondary | ICD-10-CM

## 2012-09-19 LAB — GLUCOSE, CAPILLARY: Glucose-Capillary: 128 mg/dL — ABNORMAL HIGH (ref 70–99)

## 2012-09-19 MED ORDER — PANTOPRAZOLE SODIUM 40 MG PO TBEC
40.0000 mg | DELAYED_RELEASE_TABLET | Freq: Every day | ORAL | Status: DC
  Administered 2012-09-19 – 2012-09-24 (×6): 40 mg via ORAL
  Filled 2012-09-19 (×9): qty 1

## 2012-09-19 MED ORDER — ALUM & MAG HYDROXIDE-SIMETH 200-200-20 MG/5ML PO SUSP: 30.0000 mL | ORAL | Status: DC | PRN

## 2012-09-19 MED ORDER — LEVOTHYROXINE SODIUM 25 MCG PO TABS
25.0000 ug | ORAL_TABLET | Freq: Every day | ORAL | Status: DC
  Administered 2012-09-20 – 2012-09-25 (×6): 25 ug via ORAL
  Filled 2012-09-19 (×9): qty 1

## 2012-09-19 MED ORDER — BUPROPION HCL 75 MG PO TABS
75.0000 mg | ORAL_TABLET | Freq: Every day | ORAL | Status: DC
  Administered 2012-09-20: 75 mg via ORAL
  Filled 2012-09-19 (×3): qty 1

## 2012-09-19 MED ORDER — IBUPROFEN 200 MG PO TABS
400.0000 mg | ORAL_TABLET | Freq: Four times a day (QID) | ORAL | Status: DC | PRN
  Administered 2012-09-19 – 2012-09-22 (×2): 400 mg via ORAL
  Filled 2012-09-19 (×2): qty 2

## 2012-09-19 MED ORDER — MAGNESIUM HYDROXIDE 400 MG/5ML PO SUSP: 30.0000 mL | Freq: Every day | ORAL | Status: DC | PRN

## 2012-09-19 MED ORDER — TRAZODONE HCL 50 MG PO TABS
50.0000 mg | ORAL_TABLET | Freq: Every evening | ORAL | Status: DC | PRN
  Administered 2012-09-19: 50 mg via ORAL
  Filled 2012-09-19: qty 1

## 2012-09-19 MED ORDER — QUETIAPINE FUMARATE 100 MG PO TABS
100.0000 mg | ORAL_TABLET | Freq: Every day | ORAL | Status: DC
  Administered 2012-09-19 – 2012-09-21 (×3): 100 mg via ORAL
  Filled 2012-09-19 (×5): qty 1

## 2012-09-19 MED ORDER — DIPHENHYDRAMINE HCL 50 MG PO CAPS
50.0000 mg | ORAL_CAPSULE | Freq: Every day | ORAL | Status: DC
  Administered 2012-09-19 – 2012-09-21 (×3): 50 mg via ORAL
  Filled 2012-09-19 (×8): qty 1

## 2012-09-19 MED ORDER — GABAPENTIN 100 MG PO CAPS
100.0000 mg | ORAL_CAPSULE | Freq: Three times a day (TID) | ORAL | Status: DC
  Administered 2012-09-19 – 2012-09-20 (×2): 100 mg via ORAL
  Filled 2012-09-19 (×9): qty 1

## 2012-09-19 MED ORDER — HYDROXYZINE HCL 50 MG PO TABS
50.0000 mg | ORAL_TABLET | Freq: Four times a day (QID) | ORAL | Status: DC | PRN
  Administered 2012-09-19 – 2012-09-20 (×2): 50 mg via ORAL
  Filled 2012-09-19: qty 1

## 2012-09-19 MED ORDER — ACETAMINOPHEN 325 MG PO TABS
650.0000 mg | ORAL_TABLET | Freq: Four times a day (QID) | ORAL | Status: DC | PRN
  Administered 2012-09-19 – 2012-09-25 (×3): 650 mg via ORAL

## 2012-09-19 NOTE — ED Notes (Signed)
Alprazolam 0.5mg s 16 tablets delivered to the hospital and verified with [facility] Pharmacist.
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
## 2012-09-19 NOTE — ED Provider Notes (Signed)
  Physical Exam  BP 139/91  Pulse 104  Temp(Src) 98.9 F (37.2 C) (Oral)  Resp 18  SpO2 96%  Physical Exam She is medically cleared to be moved to the psych unit for evaluation ED Course  Procedures  MDM       Arman Filter, NP 09/19/12 0010

## 2012-09-19 NOTE — BHH Counselor (Signed)
Pt was given her Seroquel to take by Asher Muir, RN. During the preparation to transfer the pt to [facility], the pt decided that she would hold her medication and wait for all her meds together at [facility]. Pt showed the nurse and writer her 2-pills and said "I don't want to take with out my other medicine, I always take it together and I don't feel comfortable not taking it together. They might decide not to give it to me". Writer observed the pt hand the pills back to the nurse and the nurse dispose of the pills. Denice Bors, AADC 09/19/2012 10:27 AM

## 2012-09-19 NOTE — ED Notes (Signed)
Patient had medication in her hand to take and she refused to take because she states that she will take when she gets to [facility]. [facility] Team member present and witness this. Medication wasted in needle Box Seroquel 50mg s

## 2012-09-19 NOTE — ED Notes (Signed)
Sitter relieved for lunch. 

## 2012-09-19 NOTE — ED Notes (Signed)
Up to rest room personal care items given

## 2012-09-19 NOTE — ED Provider Notes (Signed)
Medical screening examination/treatment/procedure(s) were performed by non-physician practitioner and as supervising physician I was immediately available for consultation/collaboration.   [place] Throckmorton III, MD 09/19/12 1302 

## 2012-09-19 NOTE — H&P (Signed)
I agree with this note. I certify that inpatient services furnished can reasonably be expected to improve the patient's condition. Shealee Yordy, MD, MSP[facility]  

## 2012-09-19 NOTE — ED Notes (Signed)
Patient ready for transfer to [facility]. Security has been notified paperwork completed and report called to [employer]. After report was given Karoline Caldwell states it will be approximately 1 hour before patient can be transferred.

## 2012-09-19 NOTE — BH Assessment (Addendum)
B[facility] Assessment Progress Note  Pt reviewed with Orson Aloe, MD, who agrees to accept her to [facility] to the service of Patrick North, MD, Rm [phone].  At 09:45 I called [facility] and notified Ranae Pila, Assessment Counselor.  Doylene Canning, MA Assessment Counselor 09/19/2012 @ 09:47

## 2012-09-19 NOTE — Progress Notes (Signed)
D. Pt has been in bed and isolative for much of the afternoon, minimal interaction and participation. Pt did express that she is having pain in her feet and also feelings of anxiety and requested medications. MD notified of request and orders received and medications administered. Pt took medications without incident but was displeased that she could not receive xanax. A. Support and encouragement provided, medication education done in regards to the medications that she would be receiving. R. Pt verbalized understanding of medications, will continue to monitor.

## 2012-09-19 NOTE — ED Provider Notes (Signed)
Pt is accepted to [facility] by Dr. Daleen Bo for depression with SI.    Gavin Pound. Oletta Lamas, MD 09/19/12 [phone]

## 2012-09-19 NOTE — ED Provider Notes (Signed)
Medical screening examination/treatment/procedure(s) were performed by non-physician practitioner and as supervising physician I was immediately available for consultation/collaboration.   Carleene Cooper III, MD 09/19/12 [phone]

## 2012-09-19 NOTE — BHH Counselor (Signed)
Writer spoke with Clayborne Dana, RN charge nurse on the adult unit and was told to send the pt and she would process the pt into the adult unit. Denice Bors, AADC 09/19/2012 11:21 AM
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
## 2012-09-19 NOTE — BHH Counselor (Signed)
Received call from Doylene Canning in the assessment office that the pt has been accepted. Dr. Dan Humphreys to Dr. Daleen Bo, adult unit 507/2. Documentation is signed completed and faxed to [facility].n Dr. Oletta Lamas notified of admission. Denice Bors, AADC 09/19/2012 9:53 AM
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
## 2012-09-19 NOTE — Tx Team (Signed)
Initial Interdisciplinary Treatment Plan  PATIENT STRENGTHS: (choose at least two) Average or above average intelligence Capable of independent living Communication skills Motivation for treatment/growth Physical Health  PATIENT STRESSORS: Health problems Loss of Boy Friend/Support Marital or family conflict   PROBLEM LIST: Problem List/Patient Goals Date to be addressed Date deferred Reason deferred Estimated date of resolution  Suicide Risk 09/19/2012     Depression 09/19/2012     Anxiety 09/19/2012     Health Concerns 09/19/2012                                    DISCHARGE CRITERIA:  Adequate post-discharge living arrangements Improved stabilization in mood, thinking, and/or behavior Motivation to continue treatment in a less acute level of care Need for constant or close observation no longer present Reduction of life-threatening or endangering symptoms to within safe limits Verbal commitment to aftercare and medication compliance  PRELIMINARY DISCHARGE PLAN: Attend aftercare/continuing care group Outpatient therapy Placement in alternative living arrangements  PATIENT/FAMIILY INVOLVEMENT: This treatment plan has been presented to and reviewed with the patient, Beverly Maddox, and/or family member.  The patient and family have been given the opportunity to ask questions and make suggestions.  Beverly Maddox Beverly Maddox 09/19/2012, 2:29 PM

## 2012-09-20 DIAGNOSIS — F4310 Post-traumatic stress disorder, unspecified: Secondary | ICD-10-CM | POA: Diagnosis present

## 2012-09-20 DIAGNOSIS — F341 Dysthymic disorder: Secondary | ICD-10-CM

## 2012-09-20 MED ORDER — GABAPENTIN 100 MG PO CAPS
100.0000 mg | ORAL_CAPSULE | Freq: Three times a day (TID) | ORAL | Status: DC | PRN
  Filled 2012-09-20: qty 1

## 2012-09-20 MED ORDER — ALPRAZOLAM 0.5 MG PO TABS
0.5000 mg | ORAL_TABLET | Freq: Every day | ORAL | Status: DC
  Administered 2012-09-20 – 2012-09-24 (×5): 0.5 mg via ORAL
  Filled 2012-09-20 (×5): qty 1

## 2012-09-20 MED ORDER — QUETIAPINE FUMARATE 100 MG PO TABS
100.0000 mg | ORAL_TABLET | Freq: Once | ORAL | Status: DC
  Filled 2012-09-20: qty 1

## 2012-09-20 MED ORDER — BUPROPION HCL 75 MG PO TABS
75.0000 mg | ORAL_TABLET | Freq: Two times a day (BID) | ORAL | Status: AC
  Administered 2012-09-20: 75 mg via ORAL
  Filled 2012-09-20: qty 1

## 2012-09-20 MED ORDER — BUPROPION HCL ER (XL) 150 MG PO TB24
150.0000 mg | ORAL_TABLET | Freq: Every day | ORAL | Status: DC
  Administered 2012-09-21 – 2012-09-24 (×4): 150 mg via ORAL
  Filled 2012-09-20 (×5): qty 1
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
## 2012-09-20 NOTE — [facility] Group Notes (Signed)
[facility] Group Notes:  (Nursing/MHT/Case Management/Adjunct)  Date:  09/20/2012  Time:  10:00AM  Type of Therapy:  Psychoeducational Skills and Goal setting and Stress Inventory  Participation Level:  Did Not Attend  Participation Quality:  Did not attend  Affect:  Did not attend  Cognitive:  Did not attend  Insight:  Lacking  Engagement in Group:  did not attend  Modes of Intervention:  Clarification, Discussion, Education, Exploration, Problem-solving and Support  Summary of Progress/Problems:  Mingo Amber 09/20/2012, 11:42 AM
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
## 2012-09-20 NOTE — [facility] Notes (Signed)
Patient ID: Beverly Maddox, female   DOB: 09-Feb-1966, 47 y.o.   MRN: [id number] D: Pt. Reports headache.  Pt. In bed earlier this shift but later she is seen in dayroom, watching TV, and interacting with staff and other clients. A: Writer introduced self to client and reviewed meds and administered Tylenol.(see MAR) Staff will monitor q50min for safety. Staff encouraged group. R: Pt. Is safe on the unit, Pt. Did not attend group. Pt. Reported relief with medication.
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
## 2012-09-20 NOTE — Progress Notes (Signed)
Pt is adamant about Xanax being the only pharmacological intervention for her anxiety. She reports that her current psychiatrist has prescribed several medications in the past for her anxiety, but this is only one that has been effective for her.  Pt is upset that her Xanax has not been continued. Pt has been informed of her substution of neurontin for her xanax. Pt also informed that she has been prescribed a scheduled dose of benadryl and Seroquel to help with anxiety and sleep in addition to her Trazodone. Pt not pleased with any of the options. Pt encouraged to discuss concerns with the MD in the morning.

## 2012-09-21 DIAGNOSIS — F4310 Post-traumatic stress disorder, unspecified: Principal | ICD-10-CM

## 2012-09-21 NOTE — [facility] Notes (Signed)
Recreation Therapy Notes  Date: 05.12.2014 Time: 3:00pm Location: [address] Day Room      Group Topic/Focus: Communication  Participation Level: None  Participation Quality: Observation  Affect: Euthymic  Cognitive: Appropriate     Additional Comments: Activity: The Question Web; Explanation: Patients were asked to pass a spool of string around the circle. As each patient passed the spool they stated a number 1 - 20. The person they passed the spool to were then asked to answer a question read by LRT.   Patient arrived to group session at approximately 3:23pm. Patient observed portion of group activity and listened to wrap up discussion about the importance of communication to building a support system.   Marykay Lex Rolanda Campa, LRT/CTRS  Safiyah Cisney L 09/21/2012 4:21 PM

## 2012-09-21 NOTE — B[facility] Group Notes (Signed)
[facility] LCSW Aftercare Discharge Planning Group Note   09/21/2012 3:40 PM  Participation Quality:  Patient did not attend group.    Rohini Jaroszewski, Joesph July

## 2012-09-21 NOTE — Progress Notes (Signed)
Patient ID: Beverly Maddox, female   DOB: 10-16-1965, 47 y.o.   MRN: [id number] D: Pt. Visible on unit interacting with other clients and watching TV. Pt. Reports depression at "7" "I try not to think today the more I'm around people I don't have to think". Pt. Reports groups have been difficult today. "I ran out of there today" "I know when people hurt" A: Writer encouraged pt. To verbalize needs. Staff will monitor q26min for safety. Staff encouraged group. R: Pt. Is safe on the unit. Pt. Attended group.

## 2012-09-21 NOTE — Progress Notes (Signed)
Adult Psychoeducational Group Note  Date:  09/21/2012 Time:  10:17 PM  Group Topic/Focus:  Wrap-Up Group:   The focus of this group is to help patients review their daily goal of treatment and discuss progress on daily workbooks.  Participation Level:  Active  Participation Quality:  Appropriate  Affect:  Appropriate  Cognitive:  Appropriate  Insight: Appropriate  Engagement in Group:  Developing/Improving  Modes of Intervention:  Exploration and Support  Additional Comments:  Pt stated that in her previous groups she was able to identify ways to improve her self esteem. Pt stated that one positive is that she is taking her medications and that they are working. Pt stated that she was also able to sleep.  Pt stated that tomorrow she wants to work on making the best out of her groups and participating.   Khadija Thier, Randal Buba 09/21/2012, 10:17 PM
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
## 2012-09-21 NOTE — Progress Notes (Signed)
D:  Patient's self inventory sheet, patient sleeps fair, has poor appetite, low energy level, poor attention span.  Rate depression and hopelessness #9.  Denied withdrawals.  SI off/on, contracts for safety.  Does have anxiety.  Worst pain #7.  No future plans.  Body aches because of fibromyalgia. A:  Medications administered per MD orders.  Emotional support and encouragement administered throughout day. Will continue to monitor for safety every 15 minutes.   R:  Denied HI.  Denied A/V hallucinations.  SI, contracts for safety.  Patient remains safe.

## 2012-09-21 NOTE — Progress Notes (Signed)
[facility] MD Progress Note  09/21/2012 1:53 PM Beverly Maddox  MRN:  [id number] Subjective:  Patient reports improvement in symptoms since her medications were adjusted yesterday. She rates depression and anxiety at seven, down from a rating of eleven yesterday. Her passive SI thoughts are decreasing in frequency. Patient reports feeling more hopeful about her future and feels more like her old self. She is becoming aware through group activities how isolated she had become. Patient tearfully stated "If I did hurt myself I'm not sure anyone would notice." Patient talked about hobbies to pursue that would help increase social supports. The patient stated "I used to do yoga. And I think I could do more with my love of photography."  Diagnosis:   Axis I: Major Depression, Recurrent severe and Post Traumatic Stress Disorder Axis II: Deferred Axis III:  Past Medical History  Diagnosis Date  . Asthma   . Ovarian cyst   . Depression   . Anxiety   . Panic attacks   . Fibromyalgia    Axis IV: economic problems, housing problems, occupational problems, other psychosocial or environmental problems and problems with primary support group Axis V: 51-60 moderate symptoms  ADL's:  Intact  Sleep: Good  Appetite:  Good  Suicidal Ideation:  Denies Homicidal Ideation:  Denies AEB (as evidenced by):  Psychiatric Specialty Exam: Review of Systems  Constitutional: Negative.   HENT: Negative.   Eyes: Negative.   Respiratory: Negative.   Cardiovascular: Negative.   Gastrointestinal: Negative.   Genitourinary: Negative.   Musculoskeletal: Negative.   Skin: Negative.   Neurological: Negative.   Endo/Heme/Allergies: Negative.   Psychiatric/Behavioral: Positive for depression. Negative for suicidal ideas, hallucinations, memory loss and substance abuse. The patient is nervous/anxious. The patient does not have insomnia.     Blood pressure 115/80, pulse 97, temperature 97.7 F (36.5 C), temperature  source Oral, resp. rate 20, height 5\' 3"  (1.6 m), weight 110.224 kg (243 lb).Body mass index is 43.06 kg/(m^2).  General Appearance: Casual and Fairly Groomed  Patent attorney::  Good  Speech:  Clear and Coherent  Volume:  Normal  Mood:  Anxious  Affect:  Congruent  Thought Process:  Goal Directed and Intact  Orientation:  Full (Time, Place, and Person)  Thought Content:  WDL  Suicidal Thoughts:  No  Homicidal Thoughts:  No  Memory:  Immediate;   Good Recent;   Good Remote;   Good  Judgement:  Fair  Insight:  Present  Psychomotor Activity:  Normal  Concentration:  Good  Recall:  Good  Akathisia:  No  Handed:  Right  AIMS (if indicated):     Assets:  Communication Skills Desire for Improvement Leisure Time Physical Health Resilience Social Support  Sleep:  Number of Hours: 5.5   Current Medications: Current Facility-Administered Medications  Medication Dose Route Frequency Provider Last Rate Last Dose  . acetaminophen (TYLENOL) tablet 650 mg  650 mg Oral Q6H PRN Mike Craze, MD   650 mg at 09/20/12 2138  . ALPRAZolam Prudy Feeler) tablet 0.5 mg  0.5 mg Oral QHS Mike Craze, MD   0.5 mg at 09/20/12 2136  . alum & mag hydroxide-simeth (MAALOX/MYLANTA) 200-200-20 MG/5ML suspension 30 mL  30 mL Oral Q4H PRN Mike Craze, MD      . buPROPion (WELLBUTRIN XL) 24 hr tablet 150 mg  150 mg Oral Daily Mike Craze, MD   150 mg at 09/21/12 0853  . diphenhydrAMINE (BENADRYL) capsule 50 mg  50 mg Oral QHS Dorma Russell  Tawnya Crook, MD   50 mg at 09/20/12 2137  . gabapentin (NEURONTIN) capsule 100 mg  100 mg Oral TID PRN Mike Craze, MD      . ibuprofen (ADVIL,MOTRIN) tablet 400 mg  400 mg Oral Q6H PRN Mike Craze, MD   400 mg at 09/19/12 2253  . levothyroxine (SYNTHROID, LEVOTHROID) tablet 25 mcg  25 mcg Oral QAC breakfast Fransisca Kaufmann, NP   25 mcg at 09/21/12 0615  . magnesium hydroxide (MILK OF MAGNESIA) suspension 30 mL  30 mL Oral Daily PRN Mike Craze, MD      . pantoprazole (PROTONIX)  EC tablet 40 mg  40 mg Oral QHS Fransisca Kaufmann, NP   40 mg at 09/20/12 2136  . QUEtiapine (SEROQUEL) tablet 100 mg  100 mg Oral QHS Mike Craze, MD   100 mg at 09/20/12 2137  . QUEtiapine (SEROQUEL) tablet 100 mg  100 mg Oral Once Mike Craze, MD        Lab Results:  Results for orders placed during the hospital encounter of 09/19/12 (from the past 48 hour(s))  GLUCOSE, CAPILLARY     Status: Abnormal   Collection Time    09/19/12  2:52 PM      Result Value Range   Glucose-Capillary 128 (*) 70 - 99 mg/dL    Physical Findings: AIMS: Facial and Oral Movements Muscles of Facial Expression: None, normal Lips and Perioral Area: None, normal Jaw: None, normal Tongue: None, normal,Extremity Movements Upper (arms, wrists, hands, fingers): None, normal Lower (legs, knees, ankles, toes): None, normal, Trunk Movements Neck, shoulders, hips: None, normal, Overall Severity Severity of abnormal movements (highest score from questions above): None, normal Incapacitation due to abnormal movements: None, normal Patient's awareness of abnormal movements (rate only patient's report): No Awareness, Dental Status Current problems with teeth and/or dentures?: No Does patient usually wear dentures?: No  CIWA:  CIWA-Ar Total: 2 COWS:  COWS Total Score: 2  Treatment Plan Summary: Daily contact with patient to assess and evaluate symptoms and progress in treatment Medication management  Plan: Continue crisis management and stabilization.  Medication management: Medications reviewed with patient and no untoward effects reported.  Encouraged patient to attend groups and participate in group counseling sessions and activities. Encourage use of healthy coping skills to manage symptoms of anxiety and depression.  Discharge plan in progress.  Address health issues: Vitals reviewed and stable.  Continue current treatment plan.   Medical Decision Making Problem Points:  Established problem,  stable/improving (1) and Review of psycho-social stressors (1) Data Points:  Review of medication regiment & side effects (2)  I certify that inpatient services furnished can reasonably be expected to improve the patient's condition.   DAVIS, LAURA NP-C 09/21/2012, 1:53 PM

## 2012-09-21 NOTE — [facility] Notes (Signed)
Adult Psychoeducational Group Note  Date:  09/21/2012 Time:  6:58 PM  Group Topic/Focus:  Self Care:   The focus of this group is to help patients understand the importance of self-care in order to improve or restore emotional, physical, spiritual, interpersonal, and financial health.  Participation Level:  Minimal  Participation Quality:  Appropriate  Affect:  Appropriate  Cognitive:  Appropriate  Insight: Limited  Engagement in Group:  Limited  Modes of Intervention:  Discussion, Education and Support  Additional Comments:  Pt actively participated in group by completing the "Self-Care Inventory" worksheet. Pt offered no contributions to group discussion.   Reinaldo Raddle K 09/21/2012, 6:58 PM

## 2012-09-22 MED ORDER — QUETIAPINE FUMARATE 25 MG PO TABS
75.0000 mg | ORAL_TABLET | Freq: Every day | ORAL | Status: DC
  Administered 2012-09-22 – 2012-09-24 (×3): 75 mg via ORAL
  Filled 2012-09-22 (×5): qty 3

## 2012-09-22 NOTE — Clinical Social Work Note (Signed)
Per patient request, referral made to [facility] in [place].  Clinical faxed and awaiting response.

## 2012-09-22 NOTE — [facility] Notes (Signed)
D:   Patient's self inventory sheet, patient sleeps well, has good appetite, low energy level, poor attention span.  Rated depression and hopelessness #7.  Anxiety #9.  Denied withdrawals.  SI off/on, contracts for safety.  Denied physical problems.   A:  Medications administered per MD orders.  Emotional support and encouragement given throughout day. R:  Denied HI.  Denied A/V hallucinations.  SI off/on, contracts for safety.  Body aches from fibromyalgia.

## 2012-09-22 NOTE — Progress Notes (Signed)
[facility] Group Notes:  (Nursing/MHT/Case Management/Adjunct)  Date:  09/22/2012  Time:  12:54 PM  Type of Therapy:  Therapeutic Activity  Participation Level:  Minimal  Participation Quality:  Appropriate, Attentive, Sharing and Supportive  Affect:  Appropriate and Flat  Cognitive:  Alert and Appropriate  Insight:  Appropriate and Good  Engagement in Group:  Improving  Modes of Intervention:  Activity and Education  Summary of Progress/Problems:  Pts were asked to play "Would you rather?" for a Therapeutic Activity. Pt was minimally engaged in group. Pt giggled, laughed and smiled  throughout group.   Beverly Maddox 09/22/2012, 12:54 PM

## 2012-09-22 NOTE — Progress Notes (Signed)
Patient ID: Beverly Maddox, female   DOB: 1965/06/26, 47 y.o.   MRN: [id number] PER STATE REGULATIONS 482.30  THIS CHART WAS REVIEWED FOR MEDICAL NECESSITY WITH RESPECT TO THE PATIENT'S ADMISSION/ DURATION OF STAY.  NEXT REVIEW DATE: 09/25/2012  Willa Rough, RN, BSN CASE MANAGER

## 2012-09-22 NOTE — [facility] Group Notes (Signed)
[facility] LCSW Aftercare Discharge Planning Group Note   09/22/2012 11:20 AM  Participation Quality:  Limited  Mood/Affect:  Appropriate and Depressed  Depression Rating:  7  Anxiety Rating:  10  Thoughts of Suicide:  Yes - off/on SI  Will you contract for safety?  Yes  Current AVH:  No  Plan for Discharge/Comments:  Patient shared she does not feel comfortable answering questions in group.  Writer met with patient individually following group.  She advised of being interested in going to [facility] in [place] at discharge.  Referral to be made.  Transportation Means: Patient uses public transportation.   Supports:  Patient has limited support system.   Loni Delbridge, Joesph July

## 2012-09-22 NOTE — Progress Notes (Signed)
[facility] MD Progress Note  09/22/2012 11:44 AM Beverly Maddox  MRN:  [id number] Subjective:  Patient reports her mood is up and down, states her mood is connected to her pain from the fibromyalgia. Has been feeling groggy this morning and would like her night time dose of Seroquel to be decreased.   Diagnosis:   Axis I: Major Depression, Recurrent severe Axis II: Deferred Axis III:  Past Medical History  Diagnosis Date  . Asthma   . Ovarian cyst   . Depression   . Anxiety   . Panic attacks   . Fibromyalgia    Axis IV: other psychosocial or environmental problems Axis V: 41-50 serious symptoms  ADL's:  Intact  Sleep: Fair  Appetite:  Fair   Psychiatric Specialty Exam: Review of Systems  Constitutional: Negative.   HENT: Negative.   Eyes: Negative.   Respiratory: Negative.   Cardiovascular: Negative.   Gastrointestinal: Negative.   Musculoskeletal: Negative.   Skin: Negative.   Neurological: Negative.   Endo/Heme/Allergies: Negative.   Psychiatric/Behavioral: Positive for depression. The patient is nervous/anxious.     Blood pressure 111/80, pulse 92, temperature 97.8 F (36.6 C), temperature source Oral, resp. rate 18, height 5\' 3"  (1.6 m), weight 110.224 kg (243 lb).Body mass index is 43.06 kg/(m^2).  General Appearance: Casual  Eye Contact::  Fair  Speech:  Normal Rate  Volume:  Decreased  Mood:  Anxious and Dysphoric  Affect:  Constricted and Depressed  Thought Process:  Circumstantial  Orientation:  Full (Time, Place, and Person)  Thought Content:  Rumination  Suicidal Thoughts:  No  Homicidal Thoughts:  No  Memory:  Immediate;   Fair Recent;   Fair Remote;   Fair  Judgement:  Fair  Insight:  Present  Psychomotor Activity:  Decreased  Concentration:  Fair  Recall:  Fair  Akathisia:  No  Handed:  Right  AIMS (if indicated):     Assets:  Communication Skills Desire for Improvement  Sleep:  Number of Hours: 6   Current Medications: Current  Facility-Administered Medications  Medication Dose Route Frequency Provider Last Rate Last Dose  . acetaminophen (TYLENOL) tablet 650 mg  650 mg Oral Q6H PRN Mike Craze, MD   650 mg at 09/20/12 2138  . ALPRAZolam Prudy Feeler) tablet 0.5 mg  0.5 mg Oral QHS Mike Craze, MD   0.5 mg at 09/21/12 2139  . alum & mag hydroxide-simeth (MAALOX/MYLANTA) 200-200-20 MG/5ML suspension 30 mL  30 mL Oral Q4H PRN Mike Craze, MD      . buPROPion (WELLBUTRIN XL) 24 hr tablet 150 mg  150 mg Oral Daily Mike Craze, MD   150 mg at 09/22/12 0851  . diphenhydrAMINE (BENADRYL) capsule 50 mg  50 mg Oral QHS Mike Craze, MD   50 mg at 09/21/12 2130  . gabapentin (NEURONTIN) capsule 100 mg  100 mg Oral TID PRN Mike Craze, MD      . ibuprofen (ADVIL,MOTRIN) tablet 400 mg  400 mg Oral Q6H PRN Mike Craze, MD   400 mg at 09/22/12 0852  . levothyroxine (SYNTHROID, LEVOTHROID) tablet 25 mcg  25 mcg Oral QAC breakfast Fransisca Kaufmann, NP   25 mcg at 09/22/12 0636  . magnesium hydroxide (MILK OF MAGNESIA) suspension 30 mL  30 mL Oral Daily PRN Mike Craze, MD      . pantoprazole (PROTONIX) EC tablet 40 mg  40 mg Oral QHS Fransisca Kaufmann, NP   40 mg at 09/21/12 2130  .  QUEtiapine (SEROQUEL) tablet 100 mg  100 mg Oral Once Mike Craze, MD      . QUEtiapine (SEROQUEL) tablet 75 mg  75 mg Oral QHS Zaki Gertsch, MD        Lab Results: No results found for this or any previous visit (from the past 48 hour(s)).  Physical Findings: AIMS: Facial and Oral Movements Muscles of Facial Expression: None, normal Lips and Perioral Area: None, normal Jaw: None, normal Tongue: None, normal,Extremity Movements Upper (arms, wrists, hands, fingers): None, normal Lower (legs, knees, ankles, toes): None, normal, Trunk Movements Neck, shoulders, hips: None, normal, Overall Severity Severity of abnormal movements (highest score from questions above): None, normal Incapacitation due to abnormal movements: None,  normal Patient's awareness of abnormal movements (rate only patient's report): No Awareness, Dental Status Current problems with teeth and/or dentures?: No Does patient usually wear dentures?: No  CIWA:  CIWA-Ar Total: 2 COWS:  COWS Total Score: 2  Treatment Plan Summary: Daily contact with patient to assess and evaluate symptoms and progress in treatment Medication management  Plan: Decrease night time Seroquel to 75mg  po qd. Provide supportive counselling and education. Continue to monitor.  Medical Decision Making Problem Points:  Established problem, stable/improving (1), Review of last therapy session (1) and Review of psycho-social stressors (1) Data Points:  Review of medication regiment & side effects (2) Review of new medications or change in dosage (2)  I certify that inpatient services furnished can reasonably be expected to improve the patient's condition.   Mykal Kirchman 09/22/2012, 11:44 AM

## 2012-09-22 NOTE — Progress Notes (Signed)
Adult Psychoeducational Group Note  Date:  09/22/2012 Time:  10:00 AM  Group Topic/Focus: Recovery Recovery Goals:   The focus of this group is to identify appropriate goals for recovery and establish a plan to achieve them.  Participation Level:  Minimal  Participation Quality:  Appropriate  Affect:  Flat  Cognitive:  Appropriate  Insight: Limited  Engagement in Group:  Limited  Additional Comments:  Patient appeared to be either dosing off or distracted.  Dwain Sarna P 09/22/2012, 3:19 PM

## 2012-09-23 NOTE — Progress Notes (Signed)
Adult Psychoeducational Group Note  Date:  09/23/2012 Time:  11:00AM Group Topic/Focus: Mental Health Crisis Coping With Mental Health Crisis:   The purpose of this group is to help patients identify strategies for coping with mental health crisis.  Group discusses possible causes of crisis and ways to manage them effectively.  Participation Level:  None  Participation Quality:  Appropriate  Affect:  Appropriate  Cognitive:  Appropriate  Engagement in Group:  None  Additional Comments: patient attended group but did leave before the group was over.  Dwain Sarna P 09/23/2012, 1:02 PM

## 2012-09-23 NOTE — [facility] Group Notes (Signed)
[facility] LCSW Aftercare Discharge Planning Group Note   09/23/2012 10:56 AM  Participation Quality: Appropriate  Mood/Affect:  Appropriate and Depressed  Depression Rating:  8  Anxiety Rating:  10  Thoughts of Suicide:  Yes - off/on SI  Will you contract for safety?  Yes  Current AVH:  No  Plan for Discharge/Comments:  Patient advised she has decided against going to [facility] in [place].  She is exploring options of where she will live at discharge.  Transportation Means: Patient uses public transportation.   Supports:  Patient has limited support system.   Moreen Piggott, Joesph July
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
## 2012-09-23 NOTE — Progress Notes (Signed)
Adult Psychoeducational Group Note  Date:  09/23/2012 Time:  11:29 PM  Group Topic/Focus:  Goals Group:   The focus of this group is to help patients establish daily goals to achieve during treatment and discuss how the patient can incorporate goal setting into their daily lives to aide in recovery.  Participation Level:  Active  Participation Quality:  Appropriate  Affect:  Appropriate  Cognitive:  Appropriate  Insight: Appropriate  Engagement in Group:  Engaged  Modes of Intervention:  Discussion  Additional Comments:  Pt was able to stated her gaol and she said that she would try and make all her groups on tomorrow.  Terie Purser R 09/23/2012, 11:29 PM

## 2012-09-23 NOTE — Progress Notes (Signed)
Writer spoke with patient 1:1 she reports that she has felt groggy most of the day due to her seroquel dosage she received the previous night. Patient was in formed that her dosage had been decreased and she was pleased. Writer informed patient of other hs meds due for later on and she reports that she does not want to take her benadryl in hopes that she will not be so groggy in the morning. Patient denies si/hi/a/visual hallucinations. Support and encouragement offered. Safety maintained with 15 min check, will continue to monitor.

## 2012-09-23 NOTE — [facility] Group Notes (Signed)
[facility] LCSW Group Therapy            Emotional Regulation       1:15 2:30 PM         09/23/2012 10:58 AM  Type of Therapy:  Group Therapy  Participation Level:  Active  Participation Quality:  Appropriate  Affect:  Appropriate  Cognitive:  Appropriate  Insight:  Developing/Improving and Engaged  Engagement in Therapy:  Developing/Improving and Engaged  Modes of Intervention:  Discussion, Education, Exploration, Problem-Solving, Rapport Building, Support  Summary of Progress/Problems:  Patient shared she hates being accused falsely or being discriminated against.   Wynn Banker 09/23/2012, 10:58 AM

## 2012-09-23 NOTE — Progress Notes (Signed)
  D) Patient pleasant and cooperative upon my assessment. Patient bright and animated upon my approach. Patient completed Patient Self Inventory, reports slept "fair," and  appetite is "improving." Patient rates depression as   8/10, patient rates hopeless feelings as 8 /10. Patient endorses "off and on" SI, contracts verbally for safety with RN. Patient denies HI, denies A/V hallucinations.   A) Patient offered support and encouragement, patient encouraged to discuss feelings/concerns with staff. Patient verbalized understanding. Patient monitored Q15 minutes for safety. Patient met with MD  to discuss today's goals and plan of care.  R) Patient visible in milieu, attending groups in day room and meals in dining room. Patient appropriate with staff and peers.   Patient taking medications as ordered. Will continue to monitor.

## 2012-09-23 NOTE — Progress Notes (Signed)
Recreation Therapy Notes  Date: 05.14.2014 Time: 3:00pm Location: [address] Dayroom      Group Topic/Focus: Decision Making  Participation Level: Active  Participation Quality: Appropriate, Attentive  Affect: Euthymic  Cognitive: Appropriate   Additional Comments: Activity: [place] ; Explanation: Patients were informed they have been exiled to a deserted [place], essential items are provided for them. Patients were asked to identify 1 piece of music, 1 book, not a bible, and 1 luxury item, not a boat to leave [address] they would like to take with them.   Patient actively participated in group activity. Patient identified the following items. [employer] [place], Gerarda Gunther, and an iPad. Patient stated she is going through "electronics withdrawal" while being a patient at [facility]. LRT encouraged patient to discover other sources of entertainment during admission. Patient stated she made decisions logically as well as based off of emotion. Patient participated in wrap up discussion about making positive healthy decisions post d/c.    Marykay Lex Khristie Sak, LRT/CTRS  Jearl Klinefelter 09/23/2012 4:42 PM

## 2012-09-24 MED ORDER — BUPROPION HCL ER (SR) 100 MG PO TB12
100.0000 mg | ORAL_TABLET | Freq: Two times a day (BID) | ORAL | Status: DC
  Filled 2012-09-24 (×6): qty 1

## 2012-09-24 MED ORDER — BUPROPION HCL 100 MG PO TABS
200.0000 mg | ORAL_TABLET | Freq: Every morning | ORAL | Status: DC
  Filled 2012-09-24 (×2): qty 2
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
## 2012-09-24 NOTE — Progress Notes (Signed)
Adult Psychoeducational Group Note  Date:  09/24/2012 Time:  9:54 PM  Group Topic/Focus:  Rediscovering Joy:   The focus of this group is to explore various ways to relieve stress in a positive manner.  Participation Level:  Minimal  Participation Quality:  Appropriate  Affect:  Appropriate  Cognitive:  Appropriate  Insight: Appropriate  Engagement in Group:  Supportive  Modes of Intervention:  Support  Additional Comments:  Adelina Mings 09/24/2012, 9:54 PM

## 2012-09-24 NOTE — Progress Notes (Signed)
Adult Psychoeducational Group Note  Date:  09/24/2012 Time:  1100  Group Topic/Focus:  Self Care:   The focus of this group is to help patients understand the importance of self-care in order to improve or restore emotional, physical, spiritual, interpersonal, and financial health.  Participation Level:  Active  Participation Quality:  Appropriate and Attentive  Affect:  Appropriate  Cognitive:  Alert and Appropriate  Insight: Appropriate  Engagement in Group:  Engaged and Supportive  Modes of Intervention:  Discussion and Socialization  Additional Comments:  Patient identifies stress trigger is "dishonest people telling lies when I already know the truth."   Beverly Maddox 09/24/2012, 12:26 PM

## 2012-09-24 NOTE — [facility] Group Notes (Signed)
[facility] LCSW Aftercare Discharge Planning Group Note   09/24/2012 1:08 PM  Participation Quality:  Appropriate  Mood/Affect:  Depressed  Depression Rating:  7  Anxiety Rating:  8  Thoughts of Suicide:  Yes  Will you contract for safety?   Yes  Current AVH:  No  Plan for Discharge/Comments:  Patient advised of being concerned about where she will go at discharge.  She shared she has not been able to make contact with friend.  Patient stated she is certain she does not want to discharge to [facility] in [place].  Annora Guderian, Joesph July

## 2012-09-24 NOTE — [facility] Group Notes (Signed)
[facility] LCSW Group Therapy  Living a Balance Life  09/24/2012 1:10 PM  Type of Therapy:  Group Therapy  Participation Level:  Active  Participation Quality:  Appropriate  Affect:  Appropriate  Cognitive:  Appropriate  Insight:  Developing/Improving and Engaged  Engagement in Therapy:  Developing/Improving and Engaged  Modes of Intervention: Discussion, Education, Exploration, Problem-Solving, Rapport Building, Support  Summary of Progress/Problems:  Patient advised she needs to balance her life by reaching back to some of the positive things she used to do.  She shared taking her medications and finding a place of her own are priorities for her at discharge. She shared she has a friend who will be helping her to explore options.  Beverly Maddox 09/24/2012, 1:10 PM

## 2012-09-24 NOTE — Progress Notes (Signed)
  D) Patient pleasant and cooperative upon my assessment. Patient completed Patient Self Inventory, reports slept "poor," and  appetite is "improving." Patient rates depression as   11/10, patient rates hopeless feelings as  9/10. Patient continues to endorse SI, contracts verbally for safety with RN.  Patient denies HI, denies A/V hallucinations.   A) Patient offered support and encouragement, patient encouraged to discuss feelings/concerns with staff. Patient verbalized understanding. Patient monitored Q15 minutes for safety. Patient met with MD  to discuss today's goals and plan of care.  R) Patient visible in milieu, attending groups in day room and meals in dining room. Patient appropriate with staff and peers.   Patient taking medications as ordered. Will continue to monitor.
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
## 2012-09-25 DIAGNOSIS — F3130 Bipolar disorder, current episode depressed, mild or moderate severity, unspecified: Secondary | ICD-10-CM

## 2012-09-25 MED ORDER — BUPROPION HCL ER (XL) 150 MG PO TB24
150.0000 mg | ORAL_TABLET | Freq: Every day | ORAL | Status: DC | PRN
Start: 2012-09-25 — End: 2012-09-29

## 2012-09-25 MED ORDER — QUETIAPINE FUMARATE 25 MG PO TABS: 75.0000 mg | ORAL_TABLET | Freq: Every day | ORAL | Status: DC

## 2012-09-25 MED ORDER — LEVOTHYROXINE SODIUM 25 MCG PO TABS: 25.0000 ug | ORAL_TABLET | Freq: Every day | ORAL | Status: DC

## 2012-09-25 MED ORDER — GABAPENTIN 100 MG PO CAPS: 100.0000 mg | ORAL_CAPSULE | Freq: Three times a day (TID) | ORAL | Status: DC | PRN

## 2012-09-25 MED ORDER — ALPRAZOLAM 0.5 MG PO TABS: 0.5000 mg | ORAL_TABLET | Freq: Every day | ORAL | Status: DC

## 2012-09-25 MED ORDER — PANTOPRAZOLE SODIUM 40 MG PO TBEC: 40.0000 mg | DELAYED_RELEASE_TABLET | Freq: Every day | ORAL | Status: DC
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
## 2012-09-25 NOTE — [facility] Suicide Risk Assessment (Signed)
Suicide Risk Assessment  Discharge Assessment     Demographic Factors:  Unemployed and NA  Mental Status Per Nursing Assessment::   On Admission:  Suicidal ideation indicated by patient;Suicide plan;Belief that plan would result in death;Thoughts of violence towards others;Intention to act on plan to harm others  Current Mental Status by Physician: In full contact with reality. There are no suicidal ideas, plans or intent. Her mood is euthymic. Her affect is appropriate. She is trying to get her life back together after she went through the failed, conflictive relationship with the female for whom she came here from [place] States she wants to establish herself in this area. She is a retired Administrator, [employer] and has some retirement income.    Loss Factors: Loss of significant relationship  Historical Factors: NA  Risk Reduction Factors:   Positive coping skills or problem solving skills  Continued Clinical Symptoms:  Depression:   Insomnia  Cognitive Features That Contribute To Risk: None Identified   Suicide Risk:  Minimal: No identifiable suicidal ideation.  Patients presenting with no risk factors but with morbid ruminations; may be classified as minimal risk based on the severity of the depressive symptoms  Discharge Diagnoses:   AXIS I:  Major Depression, PTSD AXIS II:  Deferred AXIS III:   Past Medical History  Diagnosis Date  . Asthma   . Ovarian cyst   . Depression   . Anxiety   . Panic attacks   . Fibromyalgia    AXIS IV:  housing problems and other psychosocial or environmental problems AXIS V:  61-70 mild symptoms  Plan Of Care/Follow-up recommendations:  Activity:  as tolerated Diet:  regular Follow up outpatient basis Is patient on multiple antipsychotic therapies at discharge:  No   Has Patient had three or more failed trials of antipsychotic monotherapy by history:  No  Recommended Plan for Multiple Antipsychotic Therapies: N/A   Inetha Maret  A 09/25/2012, 12:40 PM

## 2012-09-25 NOTE — Progress Notes (Signed)
[facility] Adult Case Management Discharge Plan :  Will you be returning to the same living situation after discharge: No.  Patient plans to stay in a hotel until she can obtain an apartment. At discharge, do you have transportation home?:Yes,  Friend to transport patient at discharge. Do you have the ability to pay for your medications:Yes,  Patient able to obtain medications.  Release of information consent forms completed and in the chart;  Patient's signature needed at discharge.  Patient to Follow up at: Follow-up Information   Follow up with [facility] On 09/28/2012. (Please go to [facility]'s walk in clinic on Monday, Sep 28, 2012 or any weekday between 8AM-3PM for medication managment.   Please let them know you want to get scheduled for counseling as well.)    Contact information:   [address] [place], [place]   [address]  [phone]      Patient denies SI/HI:   Patient no longer endorsing SI/HI or other thoughts of self harm.     Safety Planning and Suicide Prevention discussed:.Reviewed with all patients during discharge planning group   Ziza Hastings, Joesph July 09/25/2012, 11:51 AM

## 2012-09-25 NOTE — [facility] Suicide Risk Assessment (Signed)
[facility] INPATIENT:  Family/Significant Other Suicide Prevention Education  Suicide Prevention Education:  Patient Refusal for Family/Significant Other Suicide Prevention Education: The patient Najmah Carradine has refused to provide written consent for family/significant other to be provided Family/Significant Other Suicide Prevention Education during admission and/or prior to discharge.  Physician notified.  Patient advised she does not want anyone contacted.  Wynn Banker 09/25/2012, 12:19 PM

## 2012-09-25 NOTE — [facility] Group Notes (Signed)
[facility] LCSW Aftercare Discharge Planning Group Note   09/25/2012 11:49 AM  Participation Quality:  Appropriate  Mood/Affect:  Appropriate  Depression Rating:  1  Anxiety Rating:  1  Thoughts of Suicide:  No  Will you contract for safety?   NA  Current AVH:  No  Plan for Discharge/Comments:  Patient reports doing well and wanting to discharge today.  Patient shared she had a friend to visit last night and friend will be helping her when discharged. She shared plans to get a hotel room and to follow up with outpatient recommendations.  Transportation Means: Patient has transportation.   Supports:  Patient has limited support system.   Trigger Frasier, Joesph July

## 2012-09-25 NOTE — Progress Notes (Signed)
Adult Psychoeducational Group Note  Date:  09/25/2012 Time:  1100am  Group Topic/Focus:  Managing Feelings:   The focus of this group is to identify what feelings patients have difficulty handling and develop a plan to handle them in a healthier way upon discharge.  Participation Level:  Active  Participation Quality:  Appropriate  Affect:  Appropriate  Cognitive:  Alert and Appropriate  Insight: Appropriate  Engagement in Group:  Supportive  Modes of Intervention:  Discussion and Support  Additional Comments:  Patient identified ways in which to cope with difficult feelings and the accompanying stress.  Nestor Ramp [facility] 09/25/2012, 2:39 PM

## 2012-09-25 NOTE — Progress Notes (Signed)
Patient denies SI/HI, denies A/V hallucinations. Patient verbalizes understanding of discharge instructions, follow up care and prescriptions. Patient given all belongings from [facility] locker. Patient escorted out by staff, transported by family.

## 2012-09-25 NOTE — Progress Notes (Signed)
Adult Psychoeducational Group Note  Date:  09/25/2012 Time:  11:06 AM  Group Topic/Focus:  Therapeutic Activity   Participation Level:  Active  Participation Quality:  Appropriate  Affect:  Appropriate  Cognitive:  Appropriate  Insight: Appropriate  Engagement in Group:  Engaged  Modes of Intervention:  Activity  Additional Comments:  Pt. Participated in coping skills pictionary in which team building and interactive learning were practiced.    Ruta Hinds [facility] 09/25/2012, 11:06 AM
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
## 2012-09-29 ENCOUNTER — Emergency Department (HOSPITAL_COMMUNITY)
Admission: EM | Admit: 2012-09-29 | Discharge: 2012-09-29 | Disposition: A | Discharge status: Home / Self Care | Payer: Medicare Other | Attending: Emergency Medicine | Admitting: Emergency Medicine

## 2012-09-29 ENCOUNTER — Encounter (HOSPITAL_COMMUNITY): Payer: Self-pay | Admitting: Emergency Medicine

## 2012-09-29 DIAGNOSIS — Z880 Allergy status to penicillin: Secondary | ICD-10-CM | POA: Insufficient documentation

## 2012-09-29 DIAGNOSIS — F329 Major depressive disorder, single episode, unspecified: Secondary | ICD-10-CM | POA: Insufficient documentation

## 2012-09-29 DIAGNOSIS — R0982 Postnasal drip: Secondary | ICD-10-CM | POA: Insufficient documentation

## 2012-09-29 DIAGNOSIS — Z79899 Other long term (current) drug therapy: Secondary | ICD-10-CM | POA: Insufficient documentation

## 2012-09-29 DIAGNOSIS — J302 Other seasonal allergic rhinitis: Secondary | ICD-10-CM

## 2012-09-29 DIAGNOSIS — F411 Generalized anxiety disorder: Secondary | ICD-10-CM | POA: Insufficient documentation

## 2012-09-29 DIAGNOSIS — J45909 Unspecified asthma, uncomplicated: Secondary | ICD-10-CM | POA: Insufficient documentation

## 2012-09-29 DIAGNOSIS — R059 Cough, unspecified: Secondary | ICD-10-CM | POA: Insufficient documentation

## 2012-09-29 DIAGNOSIS — J309 Allergic rhinitis, unspecified: Secondary | ICD-10-CM | POA: Insufficient documentation

## 2012-09-29 DIAGNOSIS — R05 Cough: Secondary | ICD-10-CM

## 2012-09-29 DIAGNOSIS — J3489 Other specified disorders of nose and nasal sinuses: Secondary | ICD-10-CM | POA: Insufficient documentation

## 2012-09-29 DIAGNOSIS — Z8739 Personal history of other diseases of the musculoskeletal system and connective tissue: Secondary | ICD-10-CM | POA: Insufficient documentation

## 2012-09-29 DIAGNOSIS — R6889 Other general symptoms and signs: Secondary | ICD-10-CM | POA: Insufficient documentation

## 2012-09-29 DIAGNOSIS — Z8742 Personal history of other diseases of the female genital tract: Secondary | ICD-10-CM | POA: Insufficient documentation

## 2012-09-29 DIAGNOSIS — F3289 Other specified depressive episodes: Secondary | ICD-10-CM | POA: Insufficient documentation

## 2012-09-29 NOTE — ED Provider Notes (Signed)
Medical screening examination/treatment/procedure(s) were performed by non-physician practitioner and as supervising physician I was immediately available for consultation/collaboration.  Pegge Cumberledge, MD 09/29/12 1443 

## 2012-09-29 NOTE — ED Provider Notes (Signed)
History     CSN: [id number]  Arrival date & time 09/29/12  1254   First MD Initiated Contact with Patient 09/29/12 1307      Chief Complaint  Patient presents with  . Cough  . Nasal Congestion    (Consider location/radiation/quality/duration/timing/severity/associated sxs/prior treatment) HPI Comments: 47 y/o female presents to the ED complaining of cough, congestion and sneezing x 5 days. Patient states she was discharged from [facility] 5 days ago, and since then she has had a cough and sneezing. Cough is productive with green mucus. Admits to sinus pressure. States [place] and is not used to this climate, and back in February she had similar symptoms, went to [place] and her symptoms subsided. Denies fever, chills, nausea, vomiting, chest pain, sob, wheezing. She had not tried any alleviating factors for her symptoms.  Patient is a 47 y.o. female presenting with cough. The history is provided by the patient.  Cough Associated symptoms: no chest pain, no chills, no shortness of breath, no sore throat and no wheezing     Past Medical History  Diagnosis Date  . Asthma   . Ovarian cyst   . Depression   . Anxiety   . Panic attacks   . Fibromyalgia     Past Surgical History  Procedure Laterality Date  . Abdominal hysterectomy    . Knee surgery      No family history on file.  History  Substance Use Topics  . Smoking status: Never Smoker   . Smokeless tobacco: Not on file  . Alcohol Use: No    OB History   Grav Para Term Preterm Abortions TAB SAB Ect Mult Living                  Review of Systems  Constitutional: Negative for chills and fatigue.  HENT: Positive for congestion, sneezing and sinus pressure. Negative for sore throat.   Respiratory: Positive for cough. Negative for shortness of breath and wheezing.   Cardiovascular: Negative for chest pain.  Gastrointestinal: Negative for nausea.  All other systems reviewed and are  negative.    Allergies  Penicillins  Home Medications   Current Outpatient Rx  Name  Route  Sig  Dispense  Refill  . ALPRAZolam (XANAX) 0.5 MG tablet   Oral   Take 0.5 mg by mouth at bedtime.         Marland Kitchen buPROPion (WELLBUTRIN XL) 150 MG 24 hr tablet   Oral   Take 150 mg by mouth every morning.         Marland Kitchen levothyroxine (SYNTHROID, LEVOTHROID) 25 MCG tablet   Oral   Take 25 mcg by mouth daily before breakfast.         . OVER THE COUNTER MEDICATION   Oral   Take 2 capsules by mouth at bedtime. muscadine grape supplement         . pantoprazole (PROTONIX) 40 MG tablet   Oral   Take 40 mg by mouth at bedtime.         Marland Kitchen QUEtiapine (SEROQUEL) 25 MG tablet   Oral   Take 75 mg by mouth at bedtime.           BP 132/91  Pulse 94  Temp(Src) 97.6 F (36.4 C) (Oral)  Resp 18  SpO2 100%  Physical Exam  Nursing note and vitals reviewed. Constitutional: She is oriented to person, place, and time. She appears well-developed and well-nourished. No distress.  HENT:  Head:  Normocephalic and atraumatic.  Nose: Mucosal edema present. Right sinus exhibits maxillary sinus tenderness. Left sinus exhibits maxillary sinus tenderness.  Mouth/Throat: Uvula is midline, oropharynx is clear and moist and mucous membranes are normal.  Post nasal drip present.  Eyes: Conjunctivae are normal.  Neck: Normal range of motion. Neck supple.  Cardiovascular: Normal rate, regular rhythm and normal heart sounds.   Pulmonary/Chest: Effort normal and breath sounds normal. No respiratory distress. She has no decreased breath sounds. She has no wheezes. She has no rhonchi. She has no rales.  Musculoskeletal: Normal range of motion. She exhibits no edema.  Lymphadenopathy:    She has no cervical adenopathy.  Neurological: She is alert and oriented to person, place, and time.  Skin: Skin is warm and dry. She is not diaphoretic.  Psychiatric: She has a normal mood and affect. Her behavior is  normal.    ED Course  Procedures (including critical care time)  Labs Reviewed - No data to display No results found.   1. Seasonal allergies   2. Cough       MDM  47 y/o female with seasonal allergies, cough. Vitals stable, NAD. PE unremarkable other than post nasal drip, nasal mucosal edema and sinus tenderness. Lungs clear. Advised OTC allergy pill, mucinex DM and nasal saline. Patient states understanding of plan and is agreeable.   Trevor Mace, PA-C 09/29/12 1328

## 2012-09-29 NOTE — ED Notes (Signed)
Pt states that she was released from [facility] and felt better emotionally but on Friday started having a cough/nasal congestion.  States she is coughing up green mucous.  States she is [place] and that she is not used to this climate.

## 2012-09-30 NOTE — Progress Notes (Signed)
Patient Discharge Instructions:  After Visit Summary (AVS):   Faxed to:  09/30/12 Discharge Summary Note:   Faxed to:  09/30/12 Psychiatric Admission Assessment Note:   Faxed to:  09/30/12 Suicide Risk Assessment - Discharge Assessment:   Faxed to:  09/30/12 Faxed/Sent to the Next Level Care provider:  09/30/12 Faxed to [facility] @ [phone]  Jerelene Redden, 09/30/2012, 4:55 PM

## 2012-10-09 ENCOUNTER — Emergency Department (HOSPITAL_COMMUNITY): Payer: Medicare Other

## 2012-10-09 ENCOUNTER — Emergency Department (HOSPITAL_COMMUNITY)
Admission: EM | Admit: 2012-10-09 | Discharge: 2012-10-10 | Disposition: A | Discharge status: Home / Self Care | Payer: Medicare Other | Attending: Emergency Medicine | Admitting: Emergency Medicine

## 2012-10-09 ENCOUNTER — Encounter (HOSPITAL_COMMUNITY): Payer: Self-pay | Admitting: *Deleted

## 2012-10-09 DIAGNOSIS — IMO0001 Reserved for inherently not codable concepts without codable children: Secondary | ICD-10-CM | POA: Insufficient documentation

## 2012-10-09 DIAGNOSIS — F411 Generalized anxiety disorder: Secondary | ICD-10-CM | POA: Insufficient documentation

## 2012-10-09 DIAGNOSIS — R0602 Shortness of breath: Secondary | ICD-10-CM | POA: Insufficient documentation

## 2012-10-09 DIAGNOSIS — F410 Panic disorder [episodic paroxysmal anxiety] without agoraphobia: Secondary | ICD-10-CM | POA: Insufficient documentation

## 2012-10-09 DIAGNOSIS — F3289 Other specified depressive episodes: Secondary | ICD-10-CM | POA: Insufficient documentation

## 2012-10-09 DIAGNOSIS — F329 Major depressive disorder, single episode, unspecified: Secondary | ICD-10-CM | POA: Insufficient documentation

## 2012-10-09 DIAGNOSIS — Z8742 Personal history of other diseases of the female genital tract: Secondary | ICD-10-CM | POA: Insufficient documentation

## 2012-10-09 DIAGNOSIS — F419 Anxiety disorder, unspecified: Secondary | ICD-10-CM

## 2012-10-09 DIAGNOSIS — Z880 Allergy status to penicillin: Secondary | ICD-10-CM | POA: Insufficient documentation

## 2012-10-09 DIAGNOSIS — Z79899 Other long term (current) drug therapy: Secondary | ICD-10-CM | POA: Insufficient documentation

## 2012-10-09 DIAGNOSIS — J069 Acute upper respiratory infection, unspecified: Secondary | ICD-10-CM

## 2012-10-09 DIAGNOSIS — J45909 Unspecified asthma, uncomplicated: Secondary | ICD-10-CM | POA: Insufficient documentation

## 2012-10-09 DIAGNOSIS — R0789 Other chest pain: Secondary | ICD-10-CM | POA: Insufficient documentation

## 2012-10-09 LAB — BASIC METABOLIC PANEL
Chloride: 100 mEq/L (ref 96–112)
GFR calc Af Amer: >90 mL/min (ref >90)
GFR calc non Af Amer: 87 mL/min — ABNORMAL LOW (ref >90)
Glucose, Bld: 179 mg/dL — ABNORMAL HIGH (ref 70–99)
Potassium: 4.2 mEq/L (ref 3.5–5.1)
Sodium: 138 mEq/L (ref 135–145)

## 2012-10-09 LAB — POCT I-STAT TROPONIN I: Troponin i, poc: 0.01 ng/mL (ref 0.00–0.08)

## 2012-10-09 LAB — CBC
Hemoglobin: 13.9 g/dL (ref 12.0–15.0)
MCHC: 34.8 g/dL (ref 30.0–36.0)
RDW: 15.2 % (ref 11.5–15.5)
WBC: 8.8 10*3/uL (ref 4.0–10.5)

## 2012-10-09 LAB — URINALYSIS, ROUTINE W REFLEX MICROSCOPIC
Bilirubin Urine: NEGATIVE
Ketones, ur: 15 mg/dL — AB
Nitrite: NEGATIVE
Urobilinogen, UA: 0.2 mg/dL (ref 0.0–1.0)

## 2012-10-09 MED ORDER — HYDROCODONE-ACETAMINOPHEN 5-325 MG PO TABS
2.0000 | ORAL_TABLET | Freq: Once | ORAL | Status: AC
  Administered 2012-10-09: 2 via ORAL
  Filled 2012-10-09: qty 2

## 2012-10-09 MED ORDER — ALBUTEROL SULFATE HFA 108 (90 BASE) MCG/ACT IN AERS
2.0000 | INHALATION_SPRAY | RESPIRATORY_TRACT | Status: DC | PRN
  Administered 2012-10-09: 2 via RESPIRATORY_TRACT
  Filled 2012-10-09: qty 6.7

## 2012-10-09 NOTE — ED Provider Notes (Signed)
History     CSN: [id number]  Arrival date & time 10/09/12  4098   First MD Initiated Contact with Patient 10/09/12 2215      Chief Complaint  Patient presents with  . Chest Pain  . Shortness of Breath    (Consider location/radiation/quality/duration/timing/severity/associated sxs/prior treatment) The history is provided by the patient and medical records. No language interpreter was used.    Clydell Alberts is a 47 y.o. female  with a hx of depression, anxiety, asthma, panic attacks presents to the Emergency Department complaining of gradual, persistent, progressively worsening chest pain and pressure beginning approx 12 hours ago while talking on the phone with a friend.  Pt reports allergy symptoms for several weeks including nasal congestion, rhinorrhea, cough productive of green sputum, SOB and wheezing.  Pt has an inhaler, but does not know where it is.Today the pressure began and she began to "freak out" about her weight gain and became concerned about her heart.  Associated symptoms include headache which began after the chest pain.  Nothing makes it better or worse.  Pt denies fever, chills, diaphoresis, abdominal pain, N/V/D, weakness, syncope, dysuria, hematuria.     Past Medical History  Diagnosis Date  . Asthma   . Ovarian cyst   . Depression   . Anxiety   . Panic attacks   . Fibromyalgia     Past Surgical History  Procedure Laterality Date  . Abdominal hysterectomy    . Knee surgery      History reviewed. No pertinent family history.  History  Substance Use Topics  . Smoking status: Never Smoker   . Smokeless tobacco: Not on file  . Alcohol Use: No    OB History   Grav Para Term Preterm Abortions TAB SAB Ect Mult Living                  Review of Systems  Constitutional: Negative for fever, chills, diaphoresis, appetite change, fatigue and unexpected weight change.  HENT: Positive for congestion, rhinorrhea, postnasal drip and sinus pressure.  Negative for ear pain, sore throat, mouth sores, neck stiffness and ear discharge.   Eyes: Negative for visual disturbance.  Respiratory: Positive for cough, chest tightness and shortness of breath. Negative for wheezing and stridor.   Cardiovascular: Positive for chest pain. Negative for palpitations and leg swelling.  Gastrointestinal: Negative for nausea, vomiting, abdominal pain, diarrhea and constipation.  Endocrine: Negative for polydipsia, polyphagia and polyuria.  Genitourinary: Negative for dysuria, urgency, frequency and hematuria.  Musculoskeletal: Negative for myalgias, back pain and arthralgias.  Skin: Negative for rash.  Allergic/Immunologic: Negative for immunocompromised state.  Neurological: Negative for syncope, light-headedness, numbness and headaches.  Hematological: Negative for adenopathy. Does not bruise/bleed easily.  Psychiatric/Behavioral: Negative for sleep disturbance. The patient is nervous/anxious.   All other systems reviewed and are negative.    Allergies  Penicillins  Home Medications   Current Outpatient Rx  Name  Route  Sig  Dispense  Refill  . ALPRAZolam (XANAX) 0.5 MG tablet   Oral   Take 0.5 mg by mouth at bedtime.         Marland Kitchen buPROPion (WELLBUTRIN XL) 150 MG 24 hr tablet   Oral   Take 150 mg by mouth every morning.         Marland Kitchen levothyroxine (SYNTHROID, LEVOTHROID) 25 MCG tablet   Oral   Take 25 mcg by mouth daily before breakfast.         . Naproxen Sodium (ALEVE PO)  Oral   Take 2 tablets by mouth daily as needed (pain).         Marland Kitchen OVER THE COUNTER MEDICATION   Oral   Take 2 capsules by mouth at bedtime. muscadine grape supplement         . pantoprazole (PROTONIX) 40 MG tablet   Oral   Take 40 mg by mouth at bedtime.         Marland Kitchen QUEtiapine (SEROQUEL) 25 MG tablet   Oral   Take 25-75 mg by mouth at bedtime.            BP 121/72  Pulse 85  Temp(Src) 98.6 F (37 C) (Oral)  Resp 15  SpO2 99%  Physical Exam   Nursing note and vitals reviewed. Constitutional: She is oriented to person, place, and time. She appears well-developed and well-nourished. No distress.  HENT:  Head: Normocephalic and atraumatic.  Right Ear: Tympanic membrane, external ear and ear canal normal.  Left Ear: Tympanic membrane, external ear and ear canal normal.  Nose: Mucosal edema and rhinorrhea present. No epistaxis. Right sinus exhibits no maxillary sinus tenderness and no frontal sinus tenderness. Left sinus exhibits no maxillary sinus tenderness and no frontal sinus tenderness.  Mouth/Throat: Uvula is midline, oropharynx is clear and moist and mucous membranes are normal. Mucous membranes are not pale and not cyanotic. No oropharyngeal exudate, posterior oropharyngeal edema, posterior oropharyngeal erythema or tonsillar abscesses.  Eyes: Conjunctivae are normal. Pupils are equal, round, and reactive to light. No scleral icterus.  Neck: Normal range of motion and full passive range of motion without pain. Neck supple. No spinous process tenderness and no muscular tenderness present. No rigidity. Normal range of motion present.  Cardiovascular: Normal rate, regular rhythm, normal heart sounds and intact distal pulses.   No murmur heard. Pulmonary/Chest: Effort normal. No accessory muscle usage or stridor. Not tachypneic. No respiratory distress. She has decreased breath sounds (decreased throughout). She has no wheezes. She has no rhonchi. She has no rales. She exhibits no tenderness and no bony tenderness.  Abdominal: Soft. Bowel sounds are normal. She exhibits no mass. There is no tenderness. There is no rebound and no guarding.  Musculoskeletal: Normal range of motion. She exhibits no edema.  Lymphadenopathy:    She has no cervical adenopathy.  Neurological: She is alert and oriented to person, place, and time. She exhibits normal muscle tone. Coordination normal.  Speech is clear and goal oriented Moves extremities without  ataxia  Skin: Skin is warm and dry. No rash noted. She is not diaphoretic. No erythema.  Psychiatric: Her mood appears anxious.    ED Course  Procedures (including critical care time)  Labs Reviewed  BASIC METABOLIC PANEL - Abnormal; Notable for the following:    Glucose, Bld 179 (*)    GFR calc non Af Amer 87 (*)    All other components within normal limits  CBC  PRO B NATRIURETIC PEPTIDE  URINALYSIS, ROUTINE W REFLEX MICROSCOPIC  POCT I-STAT TROPONIN I  POCT I-STAT TROPONIN I   Dg Chest 2 View  10/09/2012   *RADIOLOGY REPORT*  Clinical Data: Chest pain, shortness of breath  CHEST - 2 VIEW  Comparison: 08/23/2011  Findings: Lungs are clear. No pleural effusion or pneumothorax.  Cardiomediastinal silhouette is within normal limits.  Visualized osseous structures are within normal limits.  IMPRESSION: No evidence of acute cardiopulmonary disease.   Original Report Authenticated By: Charline Bills, M.D.   ECG:  Date: 10/09/2012  Rate: 75  Rhythm:  normal sinus rhythm  QRS Axis: normal  Intervals: normal  ST/T Wave abnormalities: nonspecific T wave changes  Conduction Disutrbances:none  Narrative Interpretation: T wave inversion noted in V2-V3, new inversion in V2, but otherwise unchanged from the ECG on 08/25/12  Old EKG Reviewed: changes noted    1. Anxiety   2. Chest pain, atypical   3. Viral URI with cough       MDM  Zackery Barefoot presents with chest pain and reports of an episode consistent with a panic attack. Chest pain is not likely of cardiac or pulmonary etiology d/t presentation, perc negative, VSS, no tracheal deviation, no JVD or new murmur, RRR, breath sounds equal bilaterally, EKG without acute abnormalities, negative troponin x2, and negative CXR.  Patient Hx and PE consistent with anxiety.  Patient has a history of same with similar episodes.  Pt also with ssx of viral URI.  The patient is resting comfortably, in no apparent distress and asymptomatic.  No  exophthalmos, no signs of UTI. I personally reviewed the imaging tests through PACS system.  I reviewed available ER/hospitalization records through the EMR.   Stress reducing mechanisms discussed including caffeine intake.  Pt has been advised to follow-up with PCP and return to the ED if CP becomes exertional, associated with diaphoresis or nausea, radiates to left jaw/arm, worsens or becomes concerning in any way. Pt appears reliable for follow up and is agreeable to discharge.   Case has been discussed with Dr. Preston Fleeting who agrees with the above plan to discharge.         Dahlia Client Kimiyo Carmicheal, PA-C 10/09/12 [phone]

## 2012-10-09 NOTE — ED Notes (Signed)
Reports onset approx 2 hours ago of upper chest pain, and feeling sob. High stress levels and thinks its panic attacks. Pt appears calm at triage, no resp distress noted. ekg being done at triage.

## 2012-10-10 NOTE — ED Provider Notes (Signed)
Medical screening examination/treatment/procedure(s) were performed by non-physician practitioner and as supervising physician I was immediately available for consultation/collaboration.  Dione Booze, MD 10/10/12 [phone]

## 2013-09-27 IMAGING — US US PELVIS COMPLETE
1 series · 14 of 25 positions shown · non-contrast
Comparison: None.

CLINICAL DATA: Left pelvic pain, evaluate for torsion.  Status
post hysterectomy.

TRANSABDOMINAL AND TRANSVAGINAL ULTRASOUND OF PELVIS
DOPPLER ULTRASOUND OF OVARIES
TECHNIQUE: Both transabdominal and transvaginal ultrasound
examinations of the pelvis were performed. Transabdominal technique
was performed for global imaging of the pelvis including uterus,
ovaries, adnexal regions, and pelvic cul-de-sac.
It was necessary to proceed with endovaginal exam following the
transabdominal exam to visualize the bilateral ovaries.
Color and duplex Doppler ultrasound was utilized to evaluate blood
flow to the ovaries.

[Series 1: us pelvis complete · 0.32mm/px · 14 of 33 slices shown]
[im 1/33]
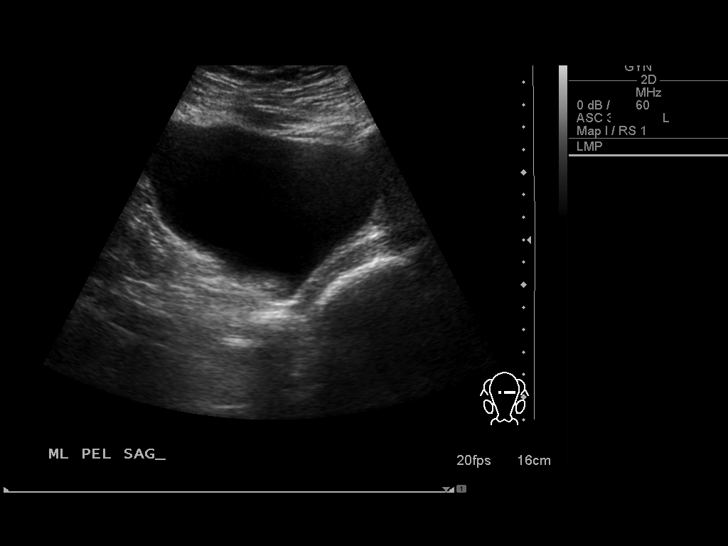
[im 3/33]
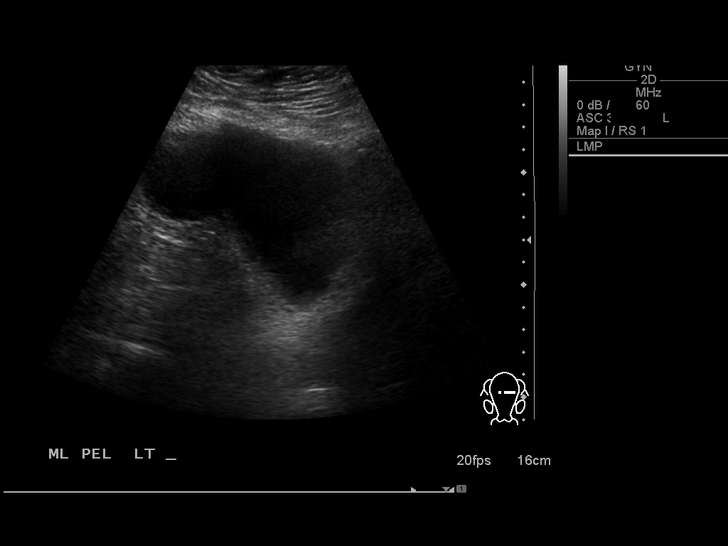
[im 6/33]
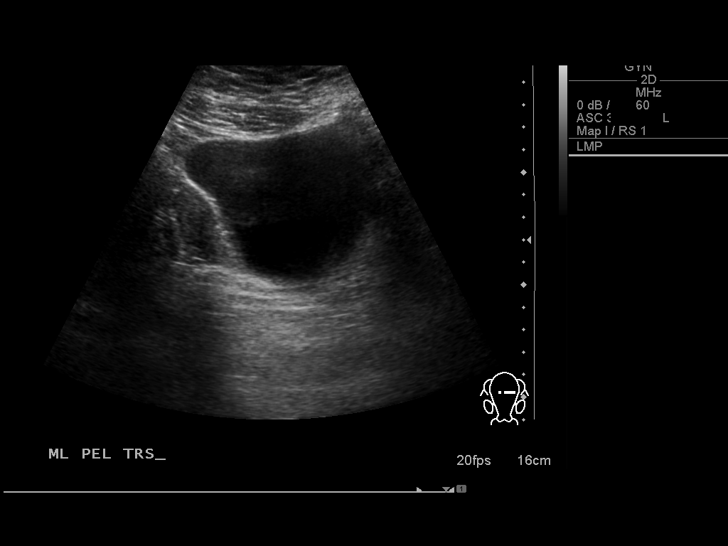
[im 9/33]
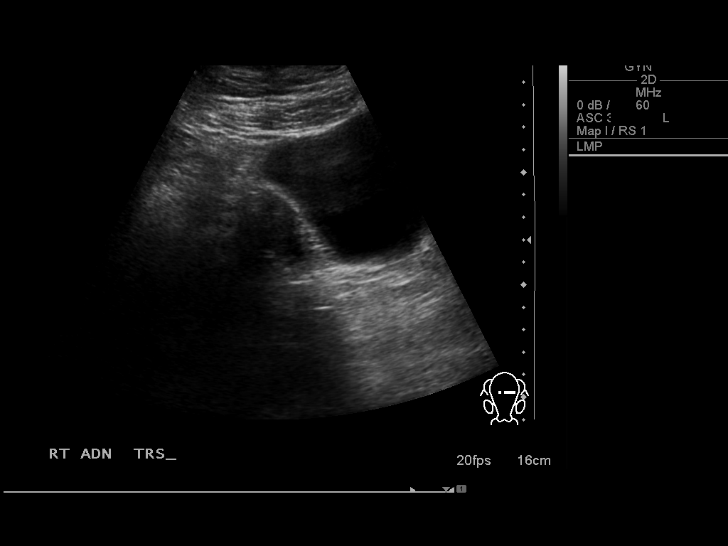
[im 11/33]
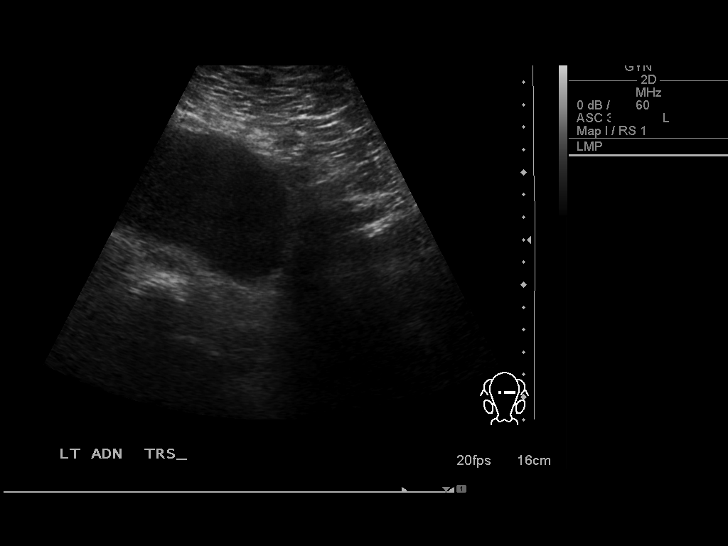
[im 13/33]
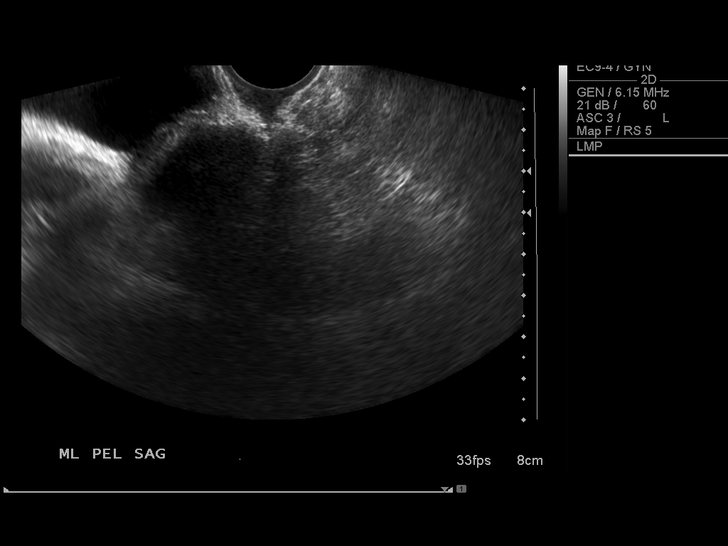
[im 15/33]
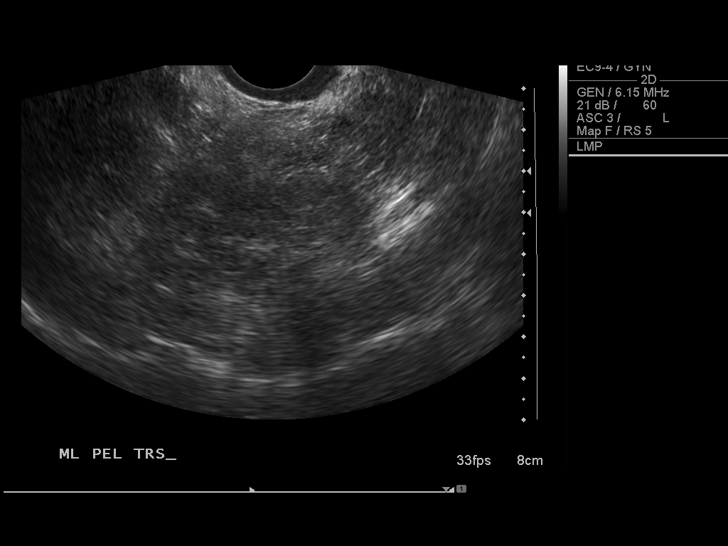
[im 18/33]
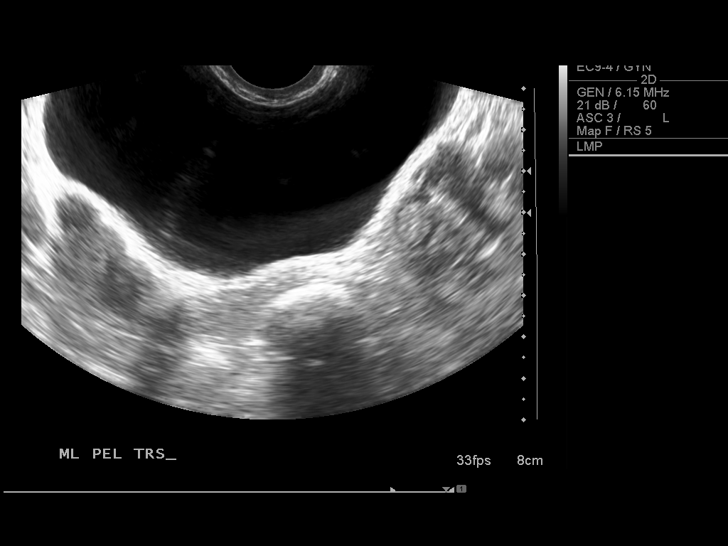
[im 21/33]
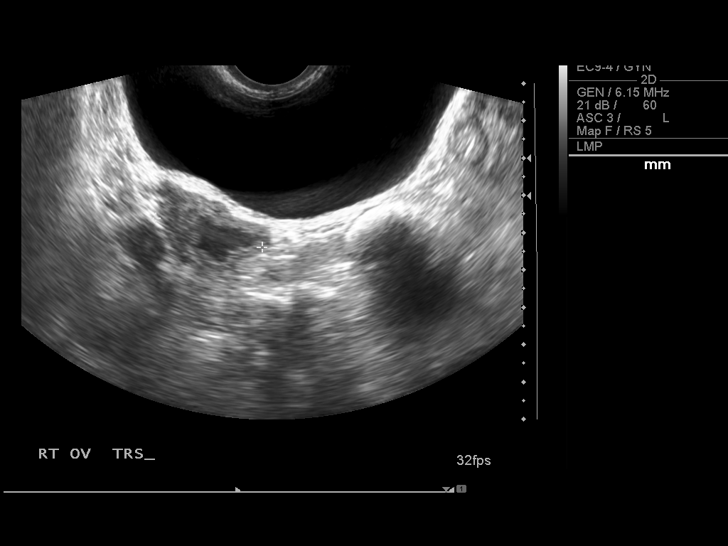
[im 22/33]
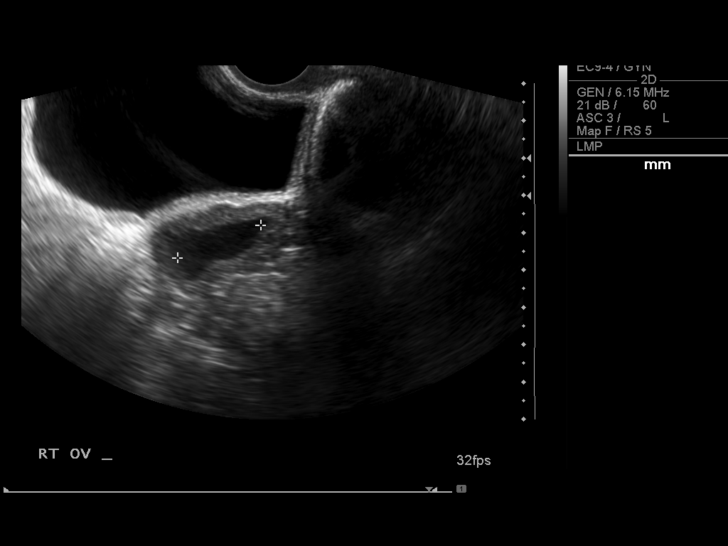
[im 25/33]
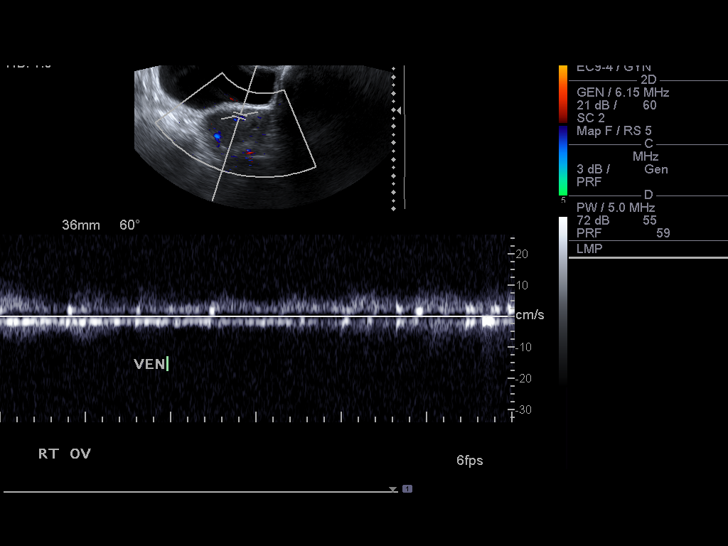
[im 27/33]
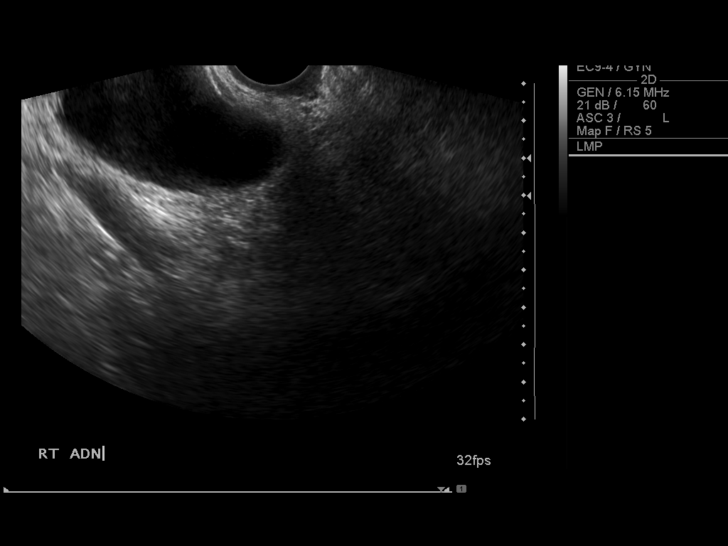
[im 30/33]
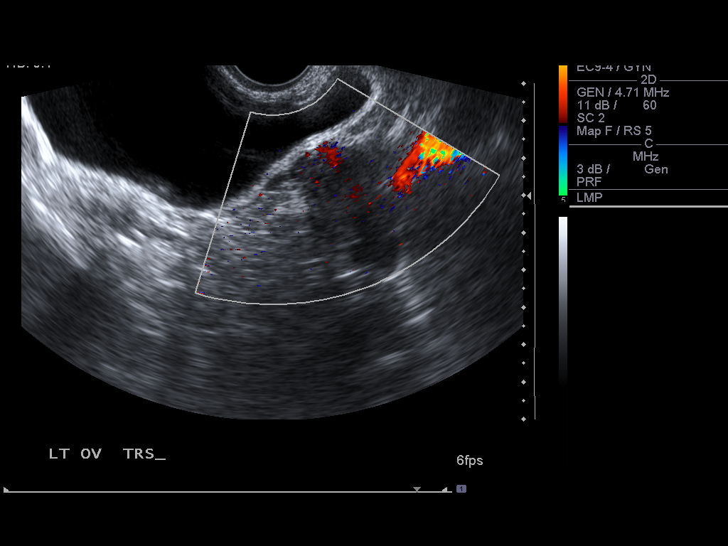
[im 33/33]
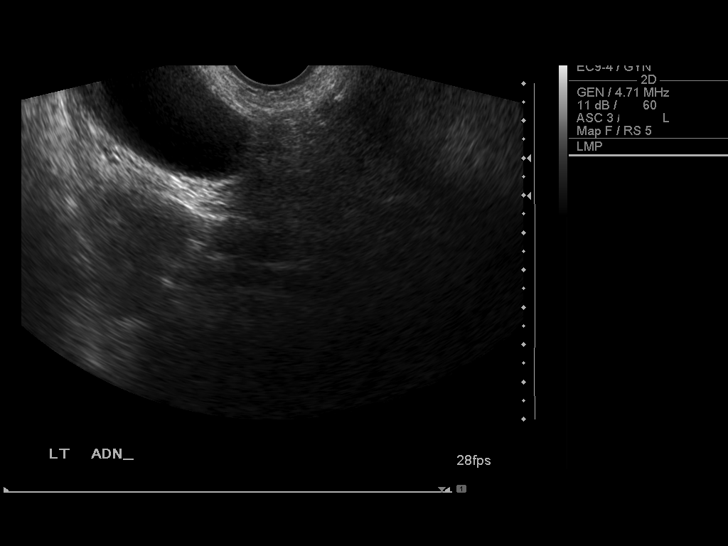

[14 of 25 positions shown; findings below may reference images not displayed]

FINDINGS: Uterus:  Surgically absent.

Right ovary: Measures 4.0 x 2.3 x 3.0 cm.  2.3 x 1.1 x 1.4 cm
involuting corpus luteal cyst.

Left ovary:   Poorly visualized.  Measures 4.5 x 2.9 x 2.1 cm.

Pulsed Doppler evaluation demonstrates normal low-resistance
arterial and venous waveforms in both ovaries.
IMPRESSION: Status post hysterectomy.  Otherwise normal pelvic ultrasound.

No sonographic evidence for ovarian torsion.

## 2013-11-23 MED ORDER — PANTOPRAZOLE 40 MG TAB, DELAYED RELEASE
40 mg | ORAL_TABLET | Freq: Every day | ORAL | Status: DC
Start: 2013-11-23 — End: 2014-02-02

## 2013-11-23 MED ORDER — ALPRAZOLAM 0.5 MG TAB
0.5 mg | ORAL_TABLET | Freq: Every evening | ORAL | Status: DC
Start: 2013-11-23 — End: 2014-02-02

## 2013-11-23 MED ORDER — QUETIAPINE 25 MG TAB
25 mg | ORAL_TABLET | Freq: Every evening | ORAL | Status: AC
Start: 2013-11-23 — End: 2013-12-13

## 2013-11-23 NOTE — ED Notes (Signed)
Pt discharged home with rx x3 and instructions to follow up with PCP provided and [facility].  Verbal understanding of all instructions and questions answered prior to discharge. All belongings taken with pt. Pt ambulatory to cath lab waiting to wait for her boyfriend.

## 2013-11-23 NOTE — ED Notes (Signed)
Pt recently moved and is concerned because she does not have a PCP or psychiatrist in the area.
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
## 2014-02-02 MED ORDER — DULOXETINE 20 MG CAP, DELAYED RELEASE
20 mg | ORAL_CAPSULE | Freq: Every day | ORAL | Status: AC
Start: 2014-02-02 — End: ?

## 2014-02-02 MED ORDER — ALPRAZOLAM 0.5 MG TAB
0.5 mg | ORAL_TABLET | Freq: Every evening | ORAL | Status: DC
Start: 2014-02-02 — End: 2014-05-25

## 2014-02-02 MED ORDER — QUETIAPINE 25 MG TAB
25 mg | ORAL_TABLET | Freq: Every evening | ORAL | Status: DC
Start: 2014-02-02 — End: 2014-08-15

## 2014-02-02 MED ORDER — PANTOPRAZOLE 40 MG TAB, DELAYED RELEASE
40 mg | ORAL_TABLET | Freq: Every day | ORAL | Status: DC
Start: 2014-02-02 — End: 2014-08-15

## 2014-02-02 NOTE — Progress Notes (Signed)
New pt     Pt is here to get established. 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 Pt states that wellbutrin caused crying spells and increased depressed  Pt has been trying to make meds last since was having trouble finding MD. Pt states that she has been trying to find a psyc to see but has not had luck.    Needs rf on meds  Pt is on seroquel,  Xanax and protonix    Pt had mammogram 3-15 . Pt states that she had cysts that were drained

## 2014-02-02 NOTE — Progress Notes (Signed)
[facility]  [address] Thelma Barge  [address]  [place], [place] [address]  [phone]    Alicia Jenkins, DOB: [birth year], AGE: 48 y.o.  Chief Complaint   Patient presents with   ??? Depression   ??? Anxiety   ??? GERD     Visit Vitals   Item Reading   ??? BP 126/82 mmHg   ??? Pulse 72   ??? Resp 20   ??? Ht  (1.6 m)   ??? Wt 253 lb (114.76 kg)   ??? BMI 44.83 kg/m2     History   Substance Use Topics   ??? Smoking status: Never Smoker    ??? Smokeless tobacco: Never Used   ??? Alcohol Use: No     Current Outpatient Prescriptions   Medication Sig Dispense Refill   ??? QUEtiapine (SEROQUEL) 25 mg tablet Take 1 Tab by mouth nightly. 30 Tab 5   ??? ALPRAZolam (XANAX) 0.5 mg tablet Take 1 Tab by mouth nightly. Max Daily Amount: 0.5 mg. 30 Tab 2   ??? pantoprazole (PROTONIX) 40 mg tablet Take 1 Tab by mouth daily. 30 Tab 5   ??? DULoxetine (CYMBALTA) 20 mg capsule Take 1 Cap by mouth daily. 30 Cap 5   ??? buPROPion XL (WELLBUTRIN XL) 150 mg tablet Take 150 mg by mouth every morning.       Allergies   Allergen Reactions   ??? Pcn [Penicillins] Anaphylaxis         HPI   History of Present Illness  Depression  This is a chronic problem. The current episode started more than 1 week ago. The problem occurs constantly. The problem has not changed since onset.Pertinent negatives include no chest pain and no shortness of breath.   Anxiety  The history is provided by the patient. This is a chronic problem. The current episode started more than 1 week ago. The problem occurs daily. The problem has not changed since onset.Pertinent negatives include no chest pain and no shortness of breath.   GERD  The history is provided by the patient. This is a chronic problem. The current episode started more than 1 week ago. The problem occurs every several days. The problem has not changed since onset.Pertinent negatives include no chest pain and no shortness of breath.       Reviewed pertinent past medical history, surgical history, social history,  and family history as noted in patient record.     ROS  Review of Systems   Constitutional: Negative for fever.   Respiratory: Negative for shortness of breath.    Cardiovascular: Negative for chest pain and leg swelling.   Gastrointestinal: Negative for nausea, vomiting and diarrhea.   Psychiatric/Behavioral: Positive for depression.       Physical Exam   Constitutional: She is well-developed, well-nourished, and in no distress.   HENT:   Head: Normocephalic and atraumatic.   Eyes: Conjunctivae are normal.   Neck: Neck supple.   Cardiovascular: Normal rate, regular rhythm and normal heart sounds.    Pulmonary/Chest: Effort normal and breath sounds normal.   Musculoskeletal: She exhibits no edema.   Neurological: She is alert.   Psychiatric: Affect normal.   Nursing note and vitals reviewed.      Lab Results  No results found for this or any previous visit (from the past 8 hour(s)).    Assessment & Plan  Alicia Jenkins presents for a chronic care visit. Pre-visit preparations were complete prior to appointment. Barriers to  treatment goals include: none  Self-management abilities include: takes medication as prescribed    Alicia Jenkins was seen today for depression, anxiety and gerd.    Diagnoses and associated orders for this visit:    Insomnia  - QUEtiapine (SEROQUEL) 25 mg tablet; Take 1 Tab by mouth nightly.    Gastroesophageal reflux disease without esophagitis  - pantoprazole (PROTONIX) 40 mg tablet; Take 1 Tab by mouth daily.    Generalized anxiety disorder  - ALPRAZolam (XANAX) 0.5 mg tablet; Take 1 Tab by mouth nightly. Max Daily Amount: 0.5 mg.    Depressive disorder, not elsewhere classified  - QUEtiapine (SEROQUEL) 25 mg tablet; Take 1 Tab by mouth nightly.  - DULoxetine (CYMBALTA) 20 mg capsule; Take 1 Cap by mouth daily.          Patient and/or caretaker counseled on the importance of balanced diet, exercise, weight management, and medication adherence. Addressed any  identified treatment barriers. Care plans, self-management plans, and any necessary patient education handouts and self-management tools provided.    Follow-up Disposition: Not on File      Ashley Royalty, MD

## 2014-03-07 ENCOUNTER — Encounter: Attending: Medical | Primary: Family Medicine

## 2014-03-08 ENCOUNTER — Encounter: Attending: Medical | Primary: Family Medicine

## 2014-03-09 NOTE — Telephone Encounter (Signed)
Pt needs a pa on the protonix [phone]       [id number]

## 2014-03-14 NOTE — Telephone Encounter (Signed)
It does not appear the pt has tried anything other than the Protonix.  Do you want to change this medication?

## 2014-03-14 NOTE — Telephone Encounter (Signed)
Omeprazole 20mg  qam if never tried 30 x 11rf

## 2014-03-15 NOTE — Telephone Encounter (Signed)
I called and LMOM to have the pt call back

## 2014-03-16 NOTE — Telephone Encounter (Signed)
Pt returned my call.  She states she has tried Prilosec and 3 other medications (she could not give me the names - due to language barrier), but the only medication she has found that works is [employer]tonix.

## 2014-03-16 NOTE — Telephone Encounter (Signed)
I called [employer], at the given number, and spoke with [place].  She gave me an APPROVAL over the phone for a 90 day supply of the Protonix 40mg .  The authorization is good from 01/15/14 - 01/14/15.    I called the pt and LMOM to notify her of the above.

## 2014-03-31 ENCOUNTER — Encounter: Attending: Medical | Primary: Family Medicine

## 2014-04-14 ENCOUNTER — Encounter: Attending: Medical | Primary: Family Medicine

## 2014-05-20 ENCOUNTER — Encounter: Attending: Family Medicine | Primary: Family Medicine

## 2014-05-25 ENCOUNTER — Ambulatory Visit: Admit: 2014-05-25 | Discharge: 2014-05-25 | Payer: MEDICARE | Attending: Family Medicine | Primary: Family Medicine

## 2014-05-25 DIAGNOSIS — E039 Hypothyroidism, unspecified: Secondary | ICD-10-CM

## 2014-05-25 MED ORDER — ALPRAZOLAM 0.5 MG TAB
0.5 mg | ORAL_TABLET | Freq: Every evening | ORAL | Status: DC
Start: 2014-05-25 — End: 2014-08-08

## 2014-05-25 NOTE — Progress Notes (Signed)
Pt is here for f/u for depression, anxiety, gerd, insomnia  Med rf: xanax    Pt states that she has hx hypothyroid and needs med rf levothyroxine. She states she was on the lowest dose of the med. Has been out of med for a "long time"    Pt states that she would like referral to endo and psyc

## 2014-05-25 NOTE — Progress Notes (Signed)
[facility]  [address] Thelma BargeFrancis  [address]  [place], [place] [id number]  [phone]    Zackery BarefootJacqueline Hagedorn, DOB: 07/30/1965, AGE: 49 y.o.  Chief Complaint   Patient presents with   ??? Insomnia   ??? Hypothyroidism     Visit Vitals   Item Reading   ??? BP 130/84 mmHg   ??? Pulse 76   ??? Resp 16   ??? Ht 5\' 3"  (1.6 m)   ??? Wt 255 lb 8 oz (115.894 kg)   ??? BMI 45.27 kg/m2     History   Substance Use Topics   ??? Smoking status: Never Smoker    ??? Smokeless tobacco: Never Used   ??? Alcohol Use: No     Current Outpatient Prescriptions   Medication Sig Dispense Refill   ??? ALPRAZolam (XANAX) 0.5 mg tablet Take 1 Tab by mouth nightly. Max Daily Amount: 0.5 mg. 30 Tab 2   ??? buPROPion XL (WELLBUTRIN XL) 150 mg tablet Take 150 mg by mouth every morning.     ??? QUEtiapine (SEROQUEL) 25 mg tablet Take 1 Tab by mouth nightly. 30 Tab 5   ??? pantoprazole (PROTONIX) 40 mg tablet Take 1 Tab by mouth daily. 30 Tab 5   ??? DULoxetine (CYMBALTA) 20 mg capsule Take 1 Cap by mouth daily. 30 Cap 5     Allergies   Allergen Reactions   ??? Pcn [Penicillins] Anaphylaxis     Health Maintenance Topics with due status: Overdue       Topic Date Due    Tdap Age > 18 07/29/1985    Td Q 10 Yrs Age > 18 07/29/1985    PAP AKA CERVICAL CYTOLOGY 07/30/1987    INFLUENZA AGE 12 TO ADULT 12/11/2013       HPI   History of Present Illness  HPI    Reviewed pertinent past medical history, surgical history, social history, and family history as noted in patient record.     ROS  ROS    Physical Exam    Lab Results  No results found for this or any previous visit (from the past 8 hour(s)).    Assessment & Plan  Zackery BarefootJacqueline Shor presents for a chronic care visit. Pre-visit preparations were complete prior to appointment. Barriers to treatment goals include: none  Self-management abilities include: takes medication as prescribed    Adela LankJacqueline was seen today for insomnia and hypothyroidism.    Diagnoses and all orders for this visit:    Acquired hypothyroidism  Orders:   -     TSH    Insomnia    Gastroesophageal reflux disease without esophagitis    GAD (generalized anxiety disorder)  Orders:  -     ALPRAZolam (XANAX) 0.5 mg tablet; Take 1 Tab by mouth nightly. Max Daily Amount: 0.5 mg.  -     REFERRAL TO PSYCHIATRY    Depression  Orders:  -     REFERRAL TO PSYCHIATRY    Generalized anxiety disorder        Patient and/or caretaker counseled on the importance of balanced diet, exercise, weight management, and medication adherence. Addressed any identified treatment barriers. Care plans, self-management plans, and any necessary patient education handouts and self-management tools provided.    Follow-up Disposition:  Return in about 3 months (around 08/24/2014) for fasting chronic care.  Set up with MyChart    Ashley RoyaltyBenjamin A Hester Jr, MD

## 2014-05-25 NOTE — Patient Instructions (Signed)
A Healthy Lifestyle: Care Instructions  Your Care Instructions  A healthy lifestyle can help you feel good, stay at a healthy weight, and have plenty of energy for both work and play. A healthy lifestyle is something you can share with your whole family.  A healthy lifestyle also can lower your risk for serious health problems, such as high blood pressure, heart disease, and diabetes.  You can follow a few steps listed below to improve your health and the health of your family.  Follow-up care is a key part of your treatment and safety. Be sure to make and go to all appointments, and call your doctor if you are having problems. It???s also a good idea to know your test results and keep a list of the medicines you take.  How can you care for yourself at home?  ?? Do not eat too much sugar, fat, or fast foods. You can still have dessert and treats now and then. The goal is moderation.  ?? Start small to improve your eating habits. Pay attention to portion sizes, drink less juice and soda pop, and eat more fruits and vegetables.  ?? Eat a healthy amount of food. A 3-ounce serving of meat, for example, is about the size of a deck of cards. Fill the rest of your plate with vegetables and whole grains.  ?? Limit the amount of soda and sports drinks you have every day. Drink more water when you are thirsty.  ?? Eat at least 5 servings of fruits and vegetables every day. It may seem like a lot, but it is not hard to reach this goal. A serving or helping is 1 piece of fruit, 1 cup of vegetables, or 2 cups of leafy, raw vegetables. Have an apple or some carrot sticks as an afternoon snack instead of a candy bar. Try to have fruits and/or vegetables at every meal.  ?? Make exercise part of your daily routine. You may want to start with simple activities, such as walking, bicycling, or slow swimming. Try to be active 30 to 60 minutes every day. You do not need to do all 30 to 60  minutes all at once. For example, you can exercise 3 times a day for 10 or 20 minutes. Moderate exercise is safe for most people, but it is always a good idea to talk to your doctor before starting an exercise program.  ?? Keep moving. Mow the lawn, work in the garden, or clean your house. Take the stairs instead of the elevator at work.  ?? If you smoke, quit. People who smoke have an increased risk for heart attack, stroke, cancer, and other lung illnesses. Quitting is hard, but there are ways to boost your chance of quitting tobacco for good.  ?? Use nicotine gum, patches, or lozenges.  ?? Ask your doctor about stop-smoking programs and medicines.  ?? Keep trying.  In addition to reducing your risk of diseases in the future, you will notice some benefits soon after you stop using tobacco. If you have shortness of breath or asthma symptoms, they will likely get better within a few weeks after you quit.  ?? Limit how much alcohol you drink. Moderate amounts of alcohol (up to 2 drinks a day for men, 1 drink a day for women) are okay. But drinking too much can lead to liver problems, high blood pressure, and other health problems.  Family health  If you have a family, there are many things you can do   together to improve your health.  ?? Eat meals together as a family as often as possible.  ?? Eat healthy foods. This includes fruits, vegetables, lean meats and dairy, and whole grains.  ?? Include your family in your fitness plan. Most people think of activities such as jogging or tennis as the way to fitness, but there are many ways you and your family can be more active. Anything that makes you breathe hard and gets your heart pumping is exercise. Here are some tips:  ?? Walk to do errands or to take your child to school or the bus.  ?? Go for a family bike ride after dinner instead of watching TV.   Where can you learn more?   Go to http://www.healthwise.net/[facility]   Enter U807 in the search box to learn more about "A Healthy Lifestyle: Care Instructions."   ?? 2006-2015 Healthwise, Incorporated. Care instructions adapted under license by [employer] (which disclaims liability or warranty for this information). This care instruction is for use with your licensed healthcare professional. If you have questions about a medical condition or this instruction, always ask your healthcare professional. Healthwise, Incorporated disclaims any warranty or liability for your use of this information.  Content Version: 10.7.482551; Current as of: March 26, 2013

## 2014-05-26 LAB — TSH 3RD GENERATION: TSH: 2.19 u[IU]/mL (ref 0.450–4.500)

## 2014-05-26 NOTE — Progress Notes (Signed)
Quick Note:        Copy of labs mailed to pt.        ______

## 2014-05-26 NOTE — Progress Notes (Signed)
Quick Note:        Ok, fuas    ______

## 2014-05-31 NOTE — Telephone Encounter (Signed)
Pt wants results of her thyroid lab and wants to know if she can pick up her script at the pharmacy.

## 2014-05-31 NOTE — Telephone Encounter (Signed)
Pt called and informed TSH was wnl and to continue current dose. Pt states that she has been out of med but was taking "lowest dose" of synthroid.  Should pt resume med? Unsure how long she has been off med

## 2014-06-01 NOTE — Telephone Encounter (Signed)
Pt called and instructed to continue to stay off med since her level was normal and she has not taken med for a few months and repeat labs in 3 mo per Dr Jackquline BoschHester. Pt voiced understanding

## 2014-06-01 NOTE — Telephone Encounter (Signed)
No stay off then and recheck 3 months for stability

## 2014-08-02 ENCOUNTER — Encounter: Attending: Family Medicine | Primary: Family Medicine

## 2014-08-08 ENCOUNTER — Ambulatory Visit: Admit: 2014-08-08 | Discharge: 2014-08-08 | Payer: MEDICARE | Attending: Family Medicine | Primary: Family Medicine

## 2014-08-08 DIAGNOSIS — N6320 Unspecified lump in the left breast, unspecified quadrant: Secondary | ICD-10-CM

## 2014-08-08 MED ORDER — ALPRAZOLAM 0.5 MG TAB
0.5 mg | ORAL_TABLET | Freq: Every evening | ORAL | Status: AC
Start: 2014-08-08 — End: ?

## 2014-08-08 NOTE — Addendum Note (Signed)
Addended by: Ashley RoyaltyHESTER, Ancil Dewan A JR. on: 08/08/2014 04:18 PM      Modules accepted: Orders

## 2014-08-08 NOTE — Progress Notes (Addendum)
[facility]  [address] Thelma BargeFrancis  [address]  [place], [place] [id number]  Ph [phone] Fax [phone]    Alicia BarefootJacqueline Jenkins, DOB: 07/30/1965, AGE: 49 y.o.  Chief Complaint   Patient presents with   ??? Gyn Exam   ??? Breast Mass     L breast x1 mo. pt had mammogram last yr and was wnl     Visit Vitals   Item Reading   ??? BP 136/86 mmHg   ??? Pulse 72   ??? Resp 16   ??? Ht 5\' 3"  (1.6 m)   ??? Wt 255 lb (115.667 kg)   ??? BMI 45.18 kg/m2     History   Substance Use Topics   ??? Smoking status: Never Smoker    ??? Smokeless tobacco: Never Used   ??? Alcohol Use: No     Current Outpatient Prescriptions   Medication Sig Dispense Refill   ??? ALPRAZolam (XANAX) 0.5 mg tablet Take 1 Tab by mouth nightly. Max Daily Amount: 0.5 mg. 30 Tab 2   ??? buPROPion XL (WELLBUTRIN XL) 150 mg tablet Take 150 mg by mouth every morning.     ??? QUEtiapine (SEROQUEL) 25 mg tablet Take 1 Tab by mouth nightly. 30 Tab 5   ??? pantoprazole (PROTONIX) 40 mg tablet Take 1 Tab by mouth daily. 30 Tab 5   ??? DULoxetine (CYMBALTA) 20 mg capsule Take 1 Cap by mouth daily. 30 Cap 5     Allergies   Allergen Reactions   ??? Pcn [Penicillins] Anaphylaxis     HPI   History of Present Illness  Gyn Exam  The history is provided by the patient. This is a new problem. Pertinent negatives include no chest pain and no shortness of breath.   Breast Mass  The history is provided by the patient. This is a new problem. The current episode started more than 1 week ago. The problem occurs constantly. The problem has been gradually worsening. Pertinent negatives include no chest pain and no shortness of breath.     Reviewed pertinent past medical history, surgical history, social history, and family history as noted in patient record.     ROS  Review of Systems   Constitutional: Negative for fever.   Respiratory: Negative for shortness of breath.    Cardiovascular: Negative for chest pain and leg swelling.   Gastrointestinal: Negative for nausea, vomiting and diarrhea.      Physical Exam   Constitutional: She is well-developed, well-nourished, and in no distress.   HENT:   Head: Normocephalic and atraumatic.   Eyes: Conjunctivae are normal.   Neck: Neck supple.   Cardiovascular: Normal rate, regular rhythm and normal heart sounds.    Pulmonary/Chest: Effort normal and breath sounds normal.   Musculoskeletal: She exhibits no edema.   Neurological: She is alert.   Psychiatric: Affect normal.   Nursing note and vitals reviewed.  left breast wit 7x10cm subareolar mass-tender/  Cervix/vagina/adnea/uterus wnl    No results found for this or any previous visit (from the past 8 hour(s)).    Assessment & Plan  Alicia Jenkins was seen today for gyn exam and breast mass.    Diagnoses and all orders for this visit:    Breast mass, left  Orders:  -     REFERRAL TO GENERAL SURGERY    Pap smear for cervical cancer screening    GAD (generalized anxiety disorder)  Orders:  -     ALPRAZolam (XANAX) 0.5 mg tablet;  Take 1 Tab by mouth nightly. Max Daily Amount: 0.5 mg.  -     REFERRAL TO PSYCHIATRY          Follow-up Disposition: Not on File  Fu as needed    Ashley Royalty, MD

## 2014-08-10 LAB — PAP (IMAGE GUIDED) W/REFL HPV ALL PATHOLOGY (507301)
.: 0
LABCORP 019018: 0

## 2014-08-10 NOTE — Progress Notes (Signed)
Quick Note:        Nl pap    ______

## 2014-08-11 ENCOUNTER — Encounter

## 2014-08-11 ENCOUNTER — Ambulatory Visit
Admit: 2014-08-11 | Discharge: 2014-08-11 | Payer: BLUE CROSS/BLUE SHIELD | Attending: Surgery | Primary: Family Medicine

## 2014-08-11 DIAGNOSIS — N6320 Unspecified lump in the left breast, unspecified quadrant: Secondary | ICD-10-CM

## 2014-08-11 NOTE — Progress Notes (Signed)
[facility]  ___________________________________________________  Orvilla Fus L. Henreitta Leber, M.D.        [address], [address]  Adalberto Ill, M.D.         [place], [place] [address]                        FAX: [phone]                        Voice: [phone]      Date of visit: 08/11/2014     Primary/Requesting provider: Ashley Royalty, MD    Chief Complaint   Patient presents with   ??? Breast Problem     left       The patient is a 49 year old Hispanic female comes in today with a history of a left breast mass that is been present for and iindeterminate amount of time, but probably several months. Her previous mammography did not show any ominous findings but this was done in [place] and so she's had no establishment here. She does not know anything about her past family history.    HPI    Review of Systems   Constitutional: Negative for fever, chills, weight loss, malaise/fatigue and diaphoresis.   HENT: Negative for congestion, ear pain, hearing loss and sore throat.    Eyes: Positive for blurred vision. Negative for double vision and pain.        Glasses   Respiratory: Negative for cough, shortness of breath and wheezing.    Cardiovascular: Positive for leg swelling. Negative for chest pain and palpitations.   Gastrointestinal: Positive for heartburn, abdominal pain and constipation. Negative for nausea, vomiting, diarrhea, blood in stool and melena.   Genitourinary: Negative for dysuria, urgency, frequency, hematuria and flank pain.   Musculoskeletal: Positive for back pain and joint pain. Negative for myalgias and neck pain.   Skin: Negative for itching and rash.   Neurological: Positive for headaches. Negative for dizziness, tingling, tremors, sensory change, focal weakness, seizures, loss of consciousness and weakness.   Endo/Heme/Allergies: Negative for environmental allergies. Does not bruise/bleed easily.    Psychiatric/Behavioral: Positive for depression. The patient is nervous/anxious and has insomnia.         Waiting to see psychiatrist     Current Outpatient Prescriptions   Medication Sig Dispense Refill   ??? ALPRAZolam (XANAX) 0.5 mg tablet Take 1 Tab by mouth nightly. Max Daily Amount: 0.5 mg. 30 Tab 2   ??? QUEtiapine (SEROQUEL) 25 mg tablet Take 1 Tab by mouth nightly. 30 Tab 5   ??? pantoprazole (PROTONIX) 40 mg tablet Take 1 Tab by mouth daily. 30 Tab 5   ??? buPROPion XL (WELLBUTRIN XL) 150 mg tablet Take 150 mg by mouth every morning.     ??? DULoxetine (CYMBALTA) 20 mg capsule Take 1 Cap by mouth daily. 30 Cap 5     Allergies   Allergen Reactions   ??? Pcn [Penicillins] Anaphylaxis     .  Past Surgical History   Procedure Laterality Date   ??? Hx knee arthroscopy Left    ??? Hx hysterectomy  2012     ovaries left     Past Medical History   Diagnosis Date   ??? Psychiatric disorder    ??? Asthma    ??? Stool color black      constipation   ??? CAD (coronary artery disease)  rheumatic fever as child   ??? Depression    ??? GERD (gastroesophageal reflux disease)    ??? Headache        Physical Exam   Constitutional: She is oriented to person, place, and time. She appears well-developed and well-nourished. She appears distressed.   HENT:   Head: Normocephalic and atraumatic.   Mouth/Throat: Oropharynx is clear and moist. No oropharyngeal exudate.   Eyes: Conjunctivae and EOM are normal. Pupils are equal, round, and reactive to light. Right eye exhibits no discharge. Left eye exhibits no discharge. No scleral icterus.   Neck: Normal range of motion. Neck supple. No tracheal deviation present.   Cardiovascular: Normal rate and regular rhythm.    Pulmonary/Chest: Effort normal and breath sounds normal. No stridor.   Breasts: Right, no palpable abnormalities. Left, mass effect at the 12 o'clock position adjacent to the nipple areolar complex measuring approximately 5 centimeters with ballotable fluid.    Abdominal: Soft. Bowel sounds are normal.   Musculoskeletal: Normal range of motion.   Neurological: She is alert and oriented to person, place, and time.   Skin: Skin is warm and dry. She is not diaphoretic.   Psychiatric: She has a normal mood and affect. Her behavior is normal. Judgment and thought content normal.   Nursing note and vitals reviewed.      ASSESSMENT and PLAN  Impression:  Breast cyst.    Disposition:: Aspiration.    Procedure: After prepping the area for puncture with 1% Xylocaine plain.. It is easily punctured with removal of approximately 34 cc of greenish tinged, cloudy fluid without any blood being seen. The cyst completely resolved. The needle was withdrawn and a Band-Aid was applied.    The patient understands that she will need to have mammography and this will be scheduled for her. She will be contacted after the results are available.

## 2014-08-11 NOTE — Progress Notes (Signed)
Quick Note:        Pt notified of results. Also pt forgot to ask you about a referral to an ob/gyn for ovarian cysts. Pt states she was told 4 yrs ago that she had very large ovarian cyst that needed to be removed. Pt has hx of ovarian cysts. Pt has had her uterus removed but they did not remove her ovaries.    ______

## 2014-08-15 MED ORDER — PANTOPRAZOLE 40 MG TAB, DELAYED RELEASE
40 mg | ORAL_TABLET | ORAL | Status: AC
Start: 2014-08-15 — End: ?

## 2014-08-15 MED ORDER — QUETIAPINE 25 MG TAB
25 mg | ORAL_TABLET | ORAL | Status: AC
Start: 2014-08-15 — End: ?

## 2014-08-31 ENCOUNTER — Encounter: Attending: Family Medicine | Primary: Family Medicine

## 2014-09-28 ENCOUNTER — Encounter: Attending: Obstetrics & Gynecology | Primary: Family Medicine

## 2014-09-28 NOTE — Telephone Encounter (Signed)
Pt called stating she would like to cancel her appt today w/ dr Kern ReapKofoed b/c she received bad news this morning.  Informed pt that I will cancel her appt.  Instructed pt to call back at a later date to reschedule her appt.  Pt  Voiced understanding.  meh,rn

## 2014-10-05 ENCOUNTER — Ambulatory Visit: Payer: MEDICARE | Primary: Family Medicine
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
## 2014-10-31 NOTE — Telephone Encounter (Signed)
Yes ov seroquel is not ativan you cant just go up and down

## 2014-10-31 NOTE — Telephone Encounter (Signed)
Patient called.  She states that she is not going to be able to make her appointment for tomorrow and wanted to speak with you about increasing her seroquel.  You can reach her at [phone].  She states that she uses [facility] in [place].

## 2014-10-31 NOTE — Telephone Encounter (Signed)
Needs ov

## 2014-11-01 ENCOUNTER — Encounter: Attending: Family Medicine | Primary: Family Medicine

## 2014-11-01 NOTE — Telephone Encounter (Signed)
Pt notified and voiced understanding.

## 2014-11-14 IMAGING — CR DG CHEST 2V
2 series · 2 of 2 positions shown · non-contrast
Comparison: 08/23/2011

CLINICAL DATA: Chest pain, shortness of breath

CHEST - 2 VIEW

[w chest pa]
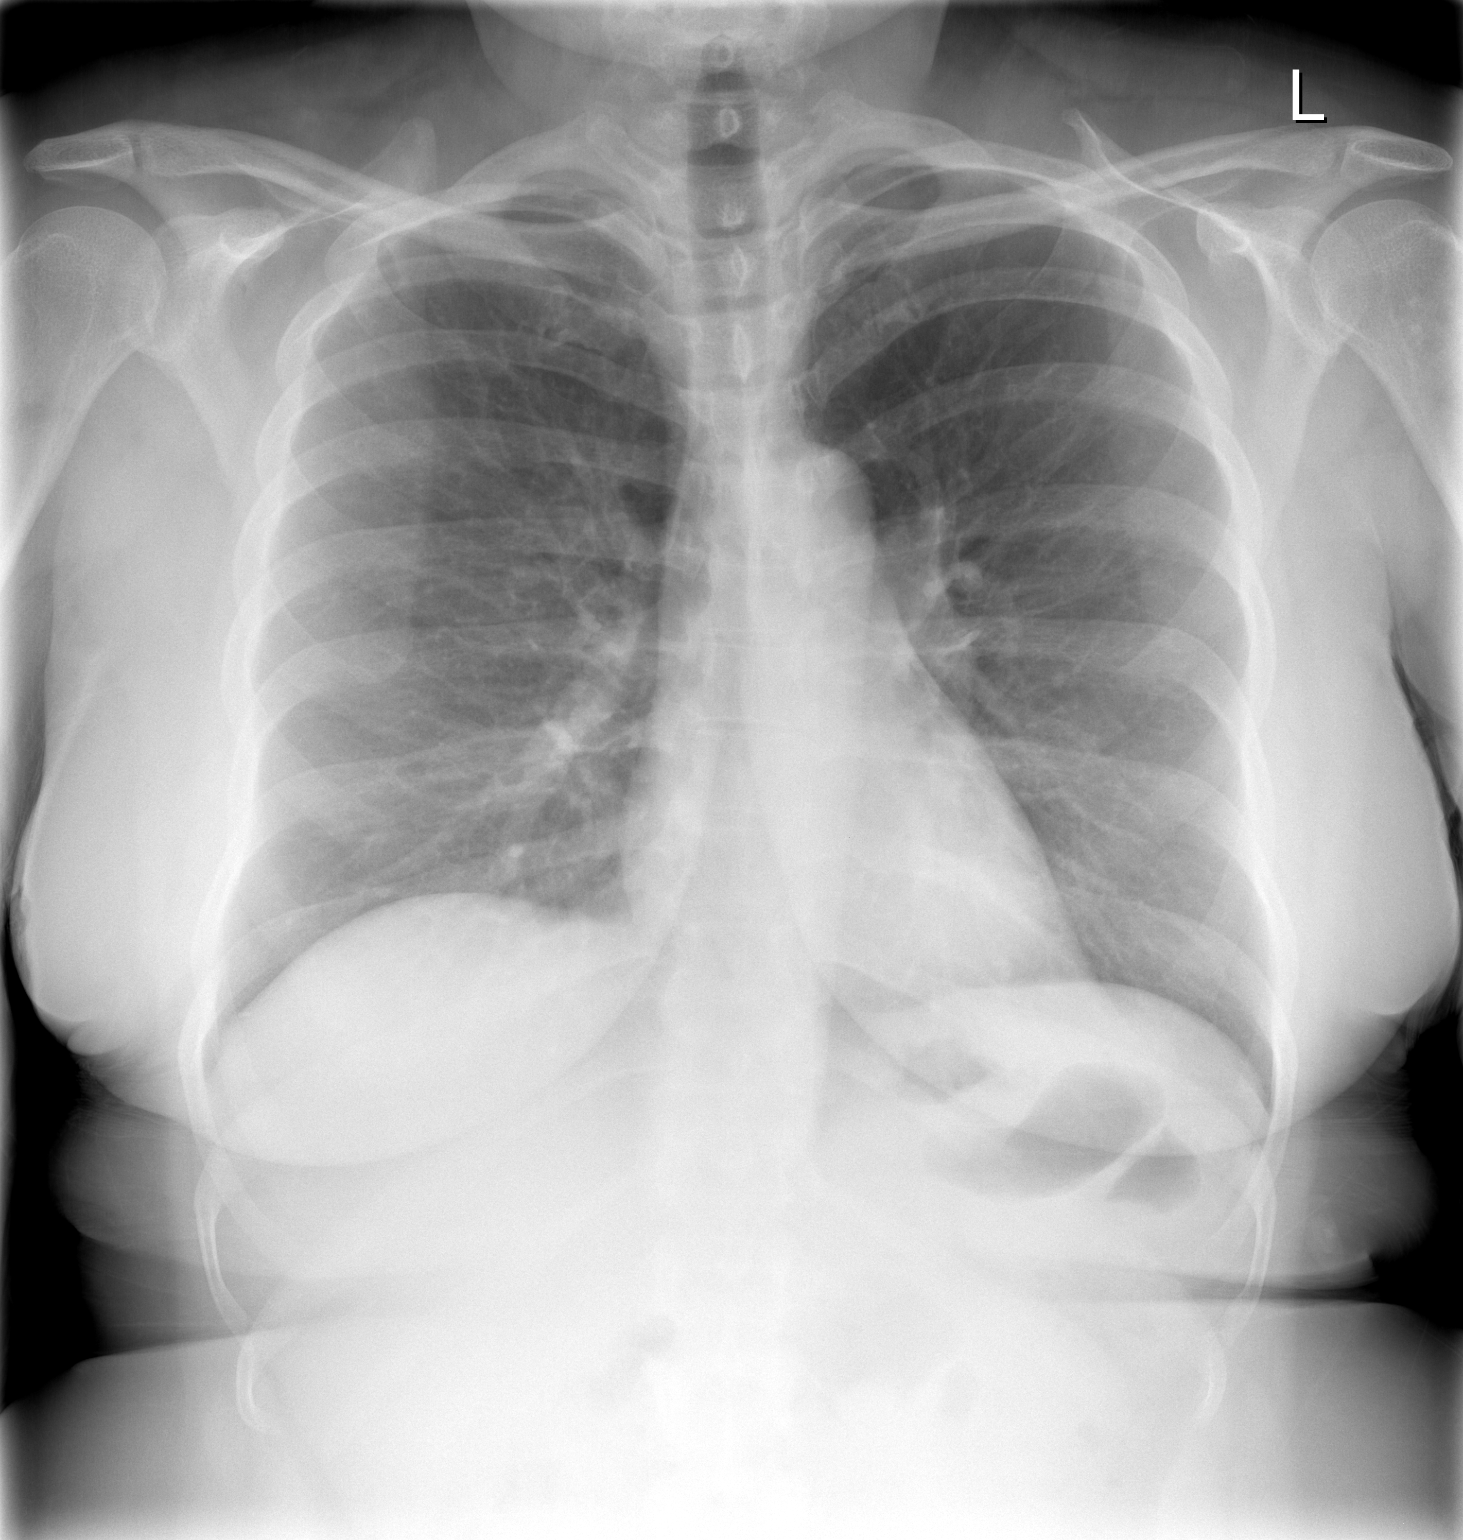

[w chest lat]
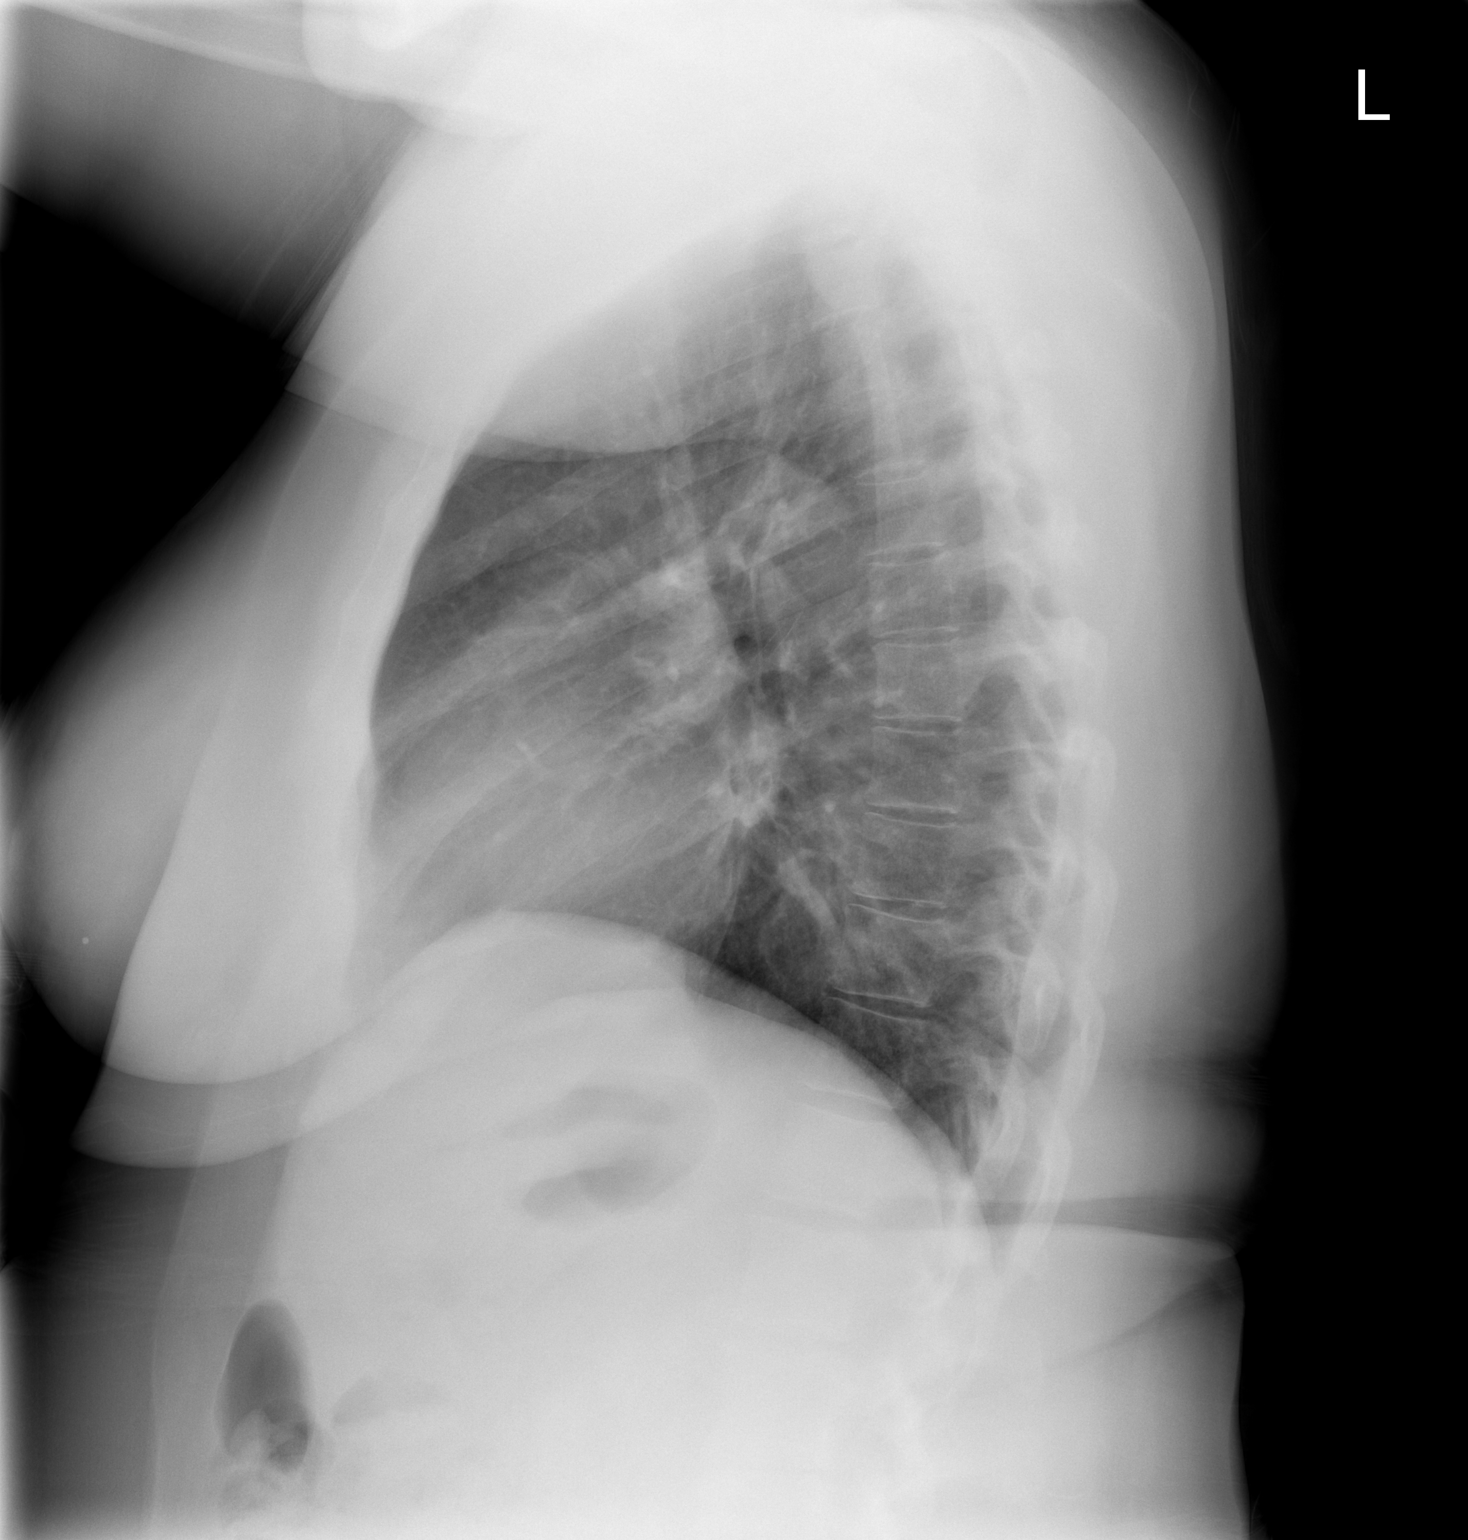

[2 of 2 positions shown; findings below may reference images not displayed]

FINDINGS: Lungs are clear. No pleural effusion or pneumothorax.

Cardiomediastinal silhouette is within normal limits.

Visualized osseous structures are within normal limits.
IMPRESSION: No evidence of acute cardiopulmonary disease.

## 2014-11-30 ENCOUNTER — Ambulatory Visit: Payer: MEDICARE | Primary: Family Medicine
